# Patient Record
Sex: Male | Born: 1965 | Race: White | Marital: Married | Smoking: Former smoker
Health system: Southern US, Community
[De-identification: ages and names within clinical notes are randomized; demographics above are authoritative.]

## PROBLEM LIST (undated history)

## (undated) DIAGNOSIS — G2581 Restless legs syndrome: Secondary | ICD-10-CM

## (undated) DIAGNOSIS — M1990 Unspecified osteoarthritis, unspecified site: Secondary | ICD-10-CM

## (undated) DIAGNOSIS — F419 Anxiety disorder, unspecified: Secondary | ICD-10-CM

## (undated) DIAGNOSIS — E7800 Pure hypercholesterolemia, unspecified: Secondary | ICD-10-CM

## (undated) DIAGNOSIS — K219 Gastro-esophageal reflux disease without esophagitis: Secondary | ICD-10-CM

## (undated) DIAGNOSIS — G629 Polyneuropathy, unspecified: Secondary | ICD-10-CM

## (undated) DIAGNOSIS — M549 Dorsalgia, unspecified: Secondary | ICD-10-CM

## (undated) DIAGNOSIS — K449 Diaphragmatic hernia without obstruction or gangrene: Secondary | ICD-10-CM

## (undated) DIAGNOSIS — I10 Essential (primary) hypertension: Secondary | ICD-10-CM

## (undated) HISTORY — DX: Gastro-esophageal reflux disease without esophagitis: K21.9

## (undated) HISTORY — PX: OTHER SURGICAL HISTORY: SHX169

## (undated) HISTORY — DX: Unspecified osteoarthritis, unspecified site: M19.90

## (undated) HISTORY — DX: Diaphragmatic hernia without obstruction or gangrene: K44.9

## (undated) HISTORY — DX: Anxiety disorder, unspecified: F41.9

---
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
## 1999-09-05 ENCOUNTER — Encounter: Payer: Self-pay | Admitting: Orthopedic Surgery

## 1999-09-05 ENCOUNTER — Encounter: Admission: RE | Admit: 1999-09-05 | Discharge: 1999-09-05 | Payer: Self-pay | Admitting: Orthopedic Surgery

## 2001-04-18 ENCOUNTER — Ambulatory Visit (HOSPITAL_COMMUNITY): Admission: RE | Admit: 2001-04-18 | Discharge: 2001-04-18 | Payer: Self-pay | Admitting: Internal Medicine

## 2001-04-18 ENCOUNTER — Encounter: Payer: Self-pay | Admitting: Internal Medicine

## 2004-02-14 ENCOUNTER — Emergency Department (HOSPITAL_COMMUNITY): Admission: EM | Admit: 2004-02-14 | Discharge: 2004-02-14 | Payer: Self-pay | Admitting: Emergency Medicine

## 2004-09-12 ENCOUNTER — Ambulatory Visit: Payer: Self-pay | Admitting: Orthopedic Surgery

## 2004-09-21 ENCOUNTER — Ambulatory Visit (HOSPITAL_COMMUNITY): Admission: RE | Admit: 2004-09-21 | Discharge: 2004-09-21 | Payer: Self-pay | Admitting: Orthopedic Surgery

## 2004-09-29 ENCOUNTER — Encounter (HOSPITAL_COMMUNITY): Admission: RE | Admit: 2004-09-29 | Discharge: 2004-10-28 | Payer: Self-pay | Admitting: Orthopedic Surgery

## 2004-10-04 ENCOUNTER — Ambulatory Visit: Payer: Self-pay | Admitting: Orthopedic Surgery

## 2004-12-07 ENCOUNTER — Ambulatory Visit: Payer: Self-pay | Admitting: Pulmonary Disease

## 2005-02-02 ENCOUNTER — Ambulatory Visit: Payer: Self-pay | Admitting: Pulmonary Disease

## 2005-03-17 ENCOUNTER — Ambulatory Visit: Payer: Self-pay | Admitting: Pulmonary Disease

## 2005-05-03 ENCOUNTER — Emergency Department (HOSPITAL_COMMUNITY): Admission: EM | Admit: 2005-05-03 | Discharge: 2005-05-03 | Payer: Self-pay | Admitting: Emergency Medicine

## 2005-05-04 ENCOUNTER — Ambulatory Visit: Payer: Self-pay | Admitting: Pulmonary Disease

## 2005-05-16 ENCOUNTER — Ambulatory Visit: Payer: Self-pay | Admitting: Pulmonary Disease

## 2005-11-17 ENCOUNTER — Ambulatory Visit: Payer: Self-pay | Admitting: Pulmonary Disease

## 2006-05-16 ENCOUNTER — Ambulatory Visit: Payer: Self-pay | Admitting: Pulmonary Disease

## 2006-05-16 ENCOUNTER — Ambulatory Visit (HOSPITAL_COMMUNITY): Admission: RE | Admit: 2006-05-16 | Discharge: 2006-05-16 | Payer: Self-pay | Admitting: Pulmonary Disease

## 2007-05-07 ENCOUNTER — Ambulatory Visit: Payer: Self-pay | Admitting: Pulmonary Disease

## 2007-05-07 LAB — CONVERTED CEMR LAB: Streptococcus, Group A Screen (Direct): NEGATIVE

## 2008-04-02 ENCOUNTER — Ambulatory Visit: Payer: Self-pay | Admitting: Internal Medicine

## 2008-04-02 DIAGNOSIS — F411 Generalized anxiety disorder: Secondary | ICD-10-CM | POA: Insufficient documentation

## 2008-04-02 DIAGNOSIS — M1990 Unspecified osteoarthritis, unspecified site: Secondary | ICD-10-CM | POA: Insufficient documentation

## 2008-04-02 DIAGNOSIS — K219 Gastro-esophageal reflux disease without esophagitis: Secondary | ICD-10-CM

## 2008-04-02 DIAGNOSIS — M25519 Pain in unspecified shoulder: Secondary | ICD-10-CM

## 2008-07-20 ENCOUNTER — Ambulatory Visit: Payer: Self-pay | Admitting: Pulmonary Disease

## 2008-07-20 DIAGNOSIS — H6690 Otitis media, unspecified, unspecified ear: Secondary | ICD-10-CM | POA: Insufficient documentation

## 2008-07-27 ENCOUNTER — Ambulatory Visit: Payer: Self-pay | Admitting: Pulmonary Disease

## 2008-07-31 ENCOUNTER — Telehealth (INDEPENDENT_AMBULATORY_CARE_PROVIDER_SITE_OTHER): Payer: Self-pay | Admitting: *Deleted

## 2009-07-02 ENCOUNTER — Ambulatory Visit: Payer: Self-pay | Admitting: Pulmonary Disease

## 2009-07-02 DIAGNOSIS — S139XXA Sprain of joints and ligaments of unspecified parts of neck, initial encounter: Secondary | ICD-10-CM

## 2009-12-08 ENCOUNTER — Ambulatory Visit: Payer: Self-pay | Admitting: Pulmonary Disease

## 2009-12-10 ENCOUNTER — Emergency Department (HOSPITAL_COMMUNITY): Admission: EM | Admit: 2009-12-10 | Discharge: 2009-12-10 | Payer: Self-pay | Admitting: Emergency Medicine

## 2010-02-11 ENCOUNTER — Telehealth: Payer: Self-pay | Admitting: Adult Health

## 2010-05-27 ENCOUNTER — Ambulatory Visit: Payer: Self-pay | Admitting: Internal Medicine

## 2010-05-27 ENCOUNTER — Encounter: Payer: Self-pay | Admitting: Adult Health

## 2010-05-27 DIAGNOSIS — R3915 Urgency of urination: Secondary | ICD-10-CM | POA: Insufficient documentation

## 2010-05-27 LAB — CONVERTED CEMR LAB
ALT: 64 units/L — ABNORMAL HIGH (ref 0–53)
AST: 33 units/L (ref 0–37)
Albumin: 4.6 g/dL (ref 3.5–5.2)
Basophils Absolute: 0 10*3/uL (ref 0.0–0.1)
Basophils Relative: 0.5 % (ref 0.0–3.0)
Bilirubin Urine: NEGATIVE
CO2: 28 meq/L (ref 19–32)
Creatinine, Ser: 0.9 mg/dL (ref 0.4–1.5)
Glucose, Bld: 96 mg/dL (ref 70–99)
HCT: 42.2 % (ref 39.0–52.0)
Hgb A1c MFr Bld: 5.5 % (ref 4.6–6.5)
Lymphocytes Relative: 38.9 % (ref 12.0–46.0)
MCV: 90.9 fL (ref 78.0–100.0)
Monocytes Relative: 5.7 % (ref 3.0–12.0)
Neutrophils Relative %: 53.7 % (ref 43.0–77.0)
Platelets: 224 10*3/uL (ref 150.0–400.0)
Sodium: 136 meq/L (ref 135–145)
Specific Gravity, Urine: 1.025 (ref 1.000–1.030)
Total Protein, Urine: NEGATIVE mg/dL
Total Protein: 7.5 g/dL (ref 6.0–8.3)
Urine Glucose: NEGATIVE mg/dL
Urobilinogen, UA: 0.2 (ref 0.0–1.0)
pH: 5.5 (ref 5.0–8.0)

## 2010-12-01 NOTE — Assessment & Plan Note (Signed)
Summary: flu shot/klw  Nurse Visit   Allergies: 1)  Erythromycin  Orders Added: 1)  Admin 1st Vaccine [90471] 2)  Flu Vaccine 56yrs + [19147] Flu Vaccine Consent Questions     Do you have a history of severe allergic reactions to this vaccine? no    Any prior history of allergic reactions to egg and/or gelatin? no    Do you have a sensitivity to the preservative Thimersol? no    Do you have a past history of Guillan-Barre Syndrome? no    Do you currently have an acute febrile illness? no    Have you ever had a severe reaction to latex? no    Vaccine information given and explained to patient? yes    Are you currently pregnant? no    Lot Number:[id number]   Exp Date:04/28/2010   Site Given  Left Deltoid IM .lbflu Tammy Zamariya Neal  December 16, 2009 11:48 AM

## 2010-12-01 NOTE — Progress Notes (Signed)
Summary: rx request/ back pain  Phone Note Call from Patient   Caller: Spouse Call For: nadel Summary of Call: spouse Douglas Fletcher says pt c/o back pain x 2 days (they have been moving). requests rx vicodin for this. Douglas # [id number]. pharm [facility] [address] in [place].  Initial call taken by: Tivis Ringer, CNA,  February 11, 2010 10:58 AM  Follow-up for Phone Call        Pt was moving and thinks he has a pulled muscle and would like RX for the pain. Please advise.Michel Bickers CMA  February 11, 2010 11:21 AM    Prescriptions: VICODIN 5-500 MG TABS (HYDROCODONE-ACETAMINOPHEN) 1 by mouth every 4-6 hr as needed  #30 x 0   Entered by:   Randell Loop CMA   Authorized by:   Rubye Oaks NP   Signed by:   Randell Loop CMA on 02/11/2010   Method used:   Telephoned to ...       [facility] (retail)       [address]       [place], [place]  [address]       Ph: [phone]       Fax: [phone]   RxID:   [id number] VICODIN 5-500 MG TABS (HYDROCODONE-ACETAMINOPHEN) 1 by mouth every 4-6 hr as needed  #20 x 0   Entered by:   Randell Loop CMA   Authorized by:   Rubye Oaks NP   Signed by:   Randell Loop CMA on 02/11/2010   Method used:   Telephoned to ...       [facility] (retail)       [address]       [place], [place]  [address]       Ph: [phone]       Fax: [phone]   RxID:   [id number]  rx has been called into the pharamcy per TP for #30 so the #20 was not called to the pharmacy---with no additional refills. Randell Loop CMA  February 11, 2010 1:03 PM

## 2010-12-01 NOTE — Letter (Signed)
Summary: Out of Work  [employer] [employer]  [address]   [place], [place] [address]   Phone: [phone]  Fax: [phone]    May 27, 2010   Employee:  Ala Dach    To Whom It May Concern:   For Medical reasons, please excuse the above named employee from work for the following dates:  Start:   July 29,2011  End:   May 27, 2010  If you need additional information, please feel free to contact our office.         Sincerely,      Rubye Oaks, NP

## 2010-12-01 NOTE — Assessment & Plan Note (Signed)
Summary: back pain/ mbw   CC:  left flank pain x 3 days---stomach has been queasy at times when he eats.  History of Present Illness: 45 year-old white Fletcher, patient of Dr. Kriste Basque, who has a known history of osteoarthritis, anxiety and gastroesophageal reflux. Known history of chronic right arm pian previous injury 20 yrs ago s/p crush injury on conveyor belt.    6/4--Last 1 weeks worse pain in right arm. Producer, television/film/video work currently. Pain down right arm , dull ahce, increased with movement, pain into right shoulder. tx with advil.   July 20, 2008--Complains of 3 days of right ear pain, decreased hearing, fullness, nasal congestion and lightheadedness. --  July 02, 2009--Presents for an acute office viist. Complains of back pain down the spine on both sides, pain in neck at shoulders  states feels like "needles stabbing" when he moves. Sharp pains w/ movement, feel stiff, sore to touch,.    May 27, 2010 --Presents for an acute office visit. Complains left flank pain x 3 days---stomach has been queasy at times when he eats, urinary frequency and urgency, testicle pressure. Denies chest pain, dyspnea, orthopnea, hemoptysis, fever, n/v/d, edema, headache,recent travel, hematuria, dysuria.  Has no insurance. Has not seen Dr. Kriste Basque in long time. Has new job w/ heating and cooling. Hurt back 2-3 weeks w/ low back pain--tx w/ ice and heat. Improved but now left side of back is sore. URine was clear today . He drinks alot of liquids, 5 cups of coffee this am, water during daytime.   Preventive Screening-Counseling & Management  Alcohol-Tobacco     Smoking Status: quit     Year Started: 2003     Year Quit: 2006     Pack years: 2ppd x2 years  Allergies: 1)  Erythromycin  Comments:  Nurse/Medical Assistant: The patient's medications and allergies were reviewed with the patient and were updated in the Medication and Allergy Lists.  Past History:  Past Medical History: Last updated:  04/02/2008 GERD (ICD-530.81) ANXIETY (ICD-300.00) OSTEOARTHRITIS (ICD-715.90)    Family History: Last updated: 04/02/2008 heart disease in grandfather mother deceased at age 96 from car accident  Social History: Last updated: 04/02/2008 pt states he smokes marijuana once a day works as a Curator drinks alcohol socially married with 3 children  Review of Systems      See HPI  Vital Signs:  Patient profile:   45 year old male Height:      70 inches Weight:      261.13 pounds BMI:     37.60 O2 Sat:      98 % on Room air Temp:     100.1 degrees F oral Pulse rate:   73 / minute BP sitting:   140 / 86  (right arm) Cuff size:   regular  Vitals Entered By: Randell Loop CMA (May 27, 2010 9:35 AM)  O2 Sat at Rest %:  98 O2 Flow:  Room air CC: left flank pain x 3 days---stomach has been queasy at times when he eats Is Patient Diabetic? No Pain Assessment Patient in pain? yes      Onset of pain  left flank pain x 3 days Comments meds and allergies updated today with pt daytime phone number reviewed with pt Randell Loop CMA  May 27, 2010 9:40 AM    Physical Exam  Additional Exam:  GENERAL:  A/Ox3; pleasant & cooperative.NAD HEENT:  [place]/AT, NOSE-clear, THROAT-clear & wnl. NECK:  Supple w/ fair ROM; no JVD; normal  carotid impulses w/o bruits; no thyromegaly or nodules palpated; no lymphadenopathy. CHEST:  Clear to P & A; w/o, wheezes/ rales/ or rhonchi. HEART:  RRR, no m/r/g  heard ABDOMEN:  Soft & nt; nml bowel sounds; no organomegaly or masses detected. EXT: Warm bilat,  no calf pain, edema, clubbing, pulses intact BACK: : tender along left lower back, neg SLR. equal strength.   NEURO: intact no focal deficits noted, equal hand grips.  Skin: no rash/lesion    Impression & Recommendations:  Problem # 1:  OSTEOARTHRITIS (ICD-715.90)  Back strain. Urine was clear.  Use ibuprofen three times a day w/ food for 5 days alternate ice and heat' The following  medications were removed from the medication list:    Vicodin 5-500 Mg Tabs (Hydrocodone-acetaminophen) .Marland Kitchen... 1 by mouth every 4-6 hrs as needed    Vicodin 5-500 Mg Tabs (Hydrocodone-acetaminophen) .Marland Kitchen... 1 by mouth every 4-6 hr as needed His updated medication list for this problem includes:    Ibuprofen 800 Mg Tabs (Ibuprofen) .Marland Kitchen... Take 1 tablet by mouth three times a day  Orders: TLB-CBC Platelet - w/Differential (85025-CBCD) TLB-BMP (Basic Metabolic Panel-BMET) (80048-METABOL) TLB-Hepatic/Liver Function Pnl (80076-HEPATIC) Est. Patient Level II ([address]109)  Problem # 2:  URINARY URGENCY (UEA-540.98) urine clear  will check A1C to rule out DM no family hx may be having urgecy/frequeny due high amt of coffee daily advsied to decrease.  Orders: TLB-Udip w/ Micro (81001-URINE) T-Urine Culture (Spectrum Order) [id number] [phone]) TLB-CBC Platelet - w/Differential (85025-CBCD) TLB-BMP (Basic Metabolic Panel-BMET) (80048-METABOL) TLB-Hepatic/Liver Function Pnl (80076-HEPATIC) TLB-A1C / Hgb A1C (Glycohemoglobin) (83036-A1C) Est. Patient Level II (62130)  Complete Medication List: 1)  Hydrochlorothiazide 25 Mg Tabs (Hydrochlorothiazide) .Marland Kitchen.. 1 by mouth once daily 2)  Gabapentin 100 Mg Caps (Gabapentin) .... Take 1 capsule by mouth two times a day 3)  Omeprazole 20 Mg Tbec (Omeprazole) .... Take 1 tablet by mouth two times a day 4)  One-a-day Mens Tabs (Multiple vitamin) .... Take 1 tablet by mouth once a day 5)  Osteo Bi-flex Joint Shield Tabs (Misc natural products) .... Take 1 tablet by mouth once a day 6)  Simvastatin 40 Mg Tabs (Simvastatin) .Marland Kitchen.. 1 by mouth once daily 7)  Ibuprofen 800 Mg Tabs (Ibuprofen) .... Take 1 tablet by mouth three times a day 8)  Zyrtec Allergy 10 Mg Tabs (Cetirizine hcl) .Marland Kitchen.. 1 by mouth at bedtime as needed drainage  Patient Instructions: 1)  Ibuprofen 800mg  three times a day w/ food x 5 days  2)  Alternate ice and heat to back as needed  3)  increase fluids.    4)  I will call with lab results.  5)  follow up Dr. Kriste Basque in 3 months for physical

## 2011-01-18 LAB — URINALYSIS, ROUTINE W REFLEX MICROSCOPIC
Bilirubin Urine: NEGATIVE
Hgb urine dipstick: NEGATIVE
Nitrite: NEGATIVE

## 2011-01-18 LAB — DIFFERENTIAL
Eosinophils Absolute: 0.1 10*3/uL (ref 0.0–0.7)
Lymphocytes Relative: 33 % (ref 12–46)
Lymphs Abs: 2.9 10*3/uL (ref 0.7–4.0)
Monocytes Relative: 8 % (ref 3–12)
Neutro Abs: 5.1 10*3/uL (ref 1.7–7.7)

## 2011-01-18 LAB — COMPREHENSIVE METABOLIC PANEL
Albumin: 4.4 g/dL (ref 3.5–5.2)
Alkaline Phosphatase: 121 U/L — ABNORMAL HIGH (ref 39–117)
CO2: 26 mEq/L (ref 19–32)
Chloride: 105 mEq/L (ref 96–112)
Creatinine, Ser: 0.93 mg/dL (ref 0.4–1.5)
GFR calc Af Amer: >60 mL/min (ref >60)
Total Bilirubin: 0.7 mg/dL (ref 0.3–1.2)

## 2011-01-18 LAB — CBC
Hemoglobin: 14.7 g/dL (ref 13.0–17.0)
RBC: 4.81 MIL/uL (ref 4.22–5.81)
WBC: 8.9 10*3/uL (ref 4.0–10.5)

## 2011-01-18 LAB — AMYLASE: Amylase: 41 U/L (ref 0–105)

## 2011-02-16 ENCOUNTER — Encounter: Payer: Self-pay | Admitting: *Deleted

## 2011-02-16 ENCOUNTER — Telehealth: Payer: Self-pay | Admitting: Pulmonary Disease

## 2011-02-16 MED ORDER — DOXYCYCLINE HYCLATE 100 MG PO CAPS: ORAL_CAPSULE | ORAL | Status: DC

## 2011-02-16 NOTE — Telephone Encounter (Signed)
Per SN---we have no openings but we can rec treatment with doxycycline 100mg    #14  1 po bid , rest, plenty of fluids and tylenol as needed. thanks

## 2011-02-16 NOTE — Telephone Encounter (Signed)
Called and spoke with pt.  Informed him SN ok'd work note for today and tomorrow and that note will be left at front desk for pt to pick up. Pt stated he will have his wife pick note up tomorrow.

## 2011-02-16 NOTE — Telephone Encounter (Signed)
Spoke w/ pt and advised him os SN recs. Pt verbalized understanding. Pt aware rx was sent to pharmacy. Pt is wanting a work not for today and tomorrow. Please advise Dr. Kriste Basque if okay to do. Thanks  Carver Fila, CMA

## 2011-02-16 NOTE — Telephone Encounter (Signed)
Ok for work note for today and tomorrow. thanks

## 2011-02-16 NOTE — Telephone Encounter (Signed)
Spoke w/ Lupita Leash and she states pt pulled a tick off his groin area yesterday. Pt c/o the spot being red, raised, little heat at the spot. Pt states he is very achy all over and feels like he may have a fever but not sure. Denies nausea and vomiting. Pt wanted to be seen but have no available openings. Please advise Dr. Kriste Basque. Thanks  Allergies  Allergen Reactions  . Erythromycin     REACTION: rash/hives, swelling in mouth and throat    Carver Fila, CMA

## 2011-02-27 ENCOUNTER — Telehealth: Payer: Self-pay | Admitting: Pulmonary Disease

## 2011-02-27 NOTE — Telephone Encounter (Signed)
Per SN---he will need ov this week with SN---in the meantime rest, increase fluids, tylenol every 4-6 hours as needed.   Can add pt on 5-1 at 2pm. thanks

## 2011-02-27 NOTE — Telephone Encounter (Signed)
Spoke w/ Douglas Fletcher and advised her of SN recs. She verbalized understanding and pt is coming in 5/1 at 2 to be evaluated by SN.

## 2011-02-27 NOTE — Telephone Encounter (Signed)
Spoke w/ Douglas Fletcher pt wife and she states pt finished his abx on Friday from being bit by a tick. Wife states pt is still achy, feels confused and is losing train of thought. Wife states the spot where he was bitten at by the tick is still red and itchy. Douglas Fletcher wants to know if pt needs another round of abx or if he needs to come in and be evaluated. Please advise Dr. Kriste Basque. Thanks  Allergies  Allergen Reactions  . Erythromycin     REACTION: rash/hives, swelling in mouth and throat    Carver Fila, CMA

## 2011-02-28 ENCOUNTER — Other Ambulatory Visit (INDEPENDENT_AMBULATORY_CARE_PROVIDER_SITE_OTHER): Payer: BC Managed Care – PPO | Admitting: Pulmonary Disease

## 2011-02-28 ENCOUNTER — Encounter: Payer: Self-pay | Admitting: *Deleted

## 2011-02-28 ENCOUNTER — Other Ambulatory Visit: Payer: Self-pay | Admitting: Pulmonary Disease

## 2011-02-28 ENCOUNTER — Encounter: Payer: Self-pay | Admitting: Pulmonary Disease

## 2011-02-28 ENCOUNTER — Ambulatory Visit (INDEPENDENT_AMBULATORY_CARE_PROVIDER_SITE_OTHER): Payer: BC Managed Care – PPO | Admitting: Pulmonary Disease

## 2011-02-28 ENCOUNTER — Other Ambulatory Visit (INDEPENDENT_AMBULATORY_CARE_PROVIDER_SITE_OTHER): Payer: BC Managed Care – PPO

## 2011-02-28 VITALS — BP 122/80 | HR 68 | Temp 99.2°F | Ht 70.0 in | Wt 239.0 lb

## 2011-02-28 DIAGNOSIS — K219 Gastro-esophageal reflux disease without esophagitis: Secondary | ICD-10-CM

## 2011-02-28 DIAGNOSIS — F411 Generalized anxiety disorder: Secondary | ICD-10-CM

## 2011-02-28 DIAGNOSIS — M1990 Unspecified osteoarthritis, unspecified site: Secondary | ICD-10-CM

## 2011-02-28 DIAGNOSIS — E785 Hyperlipidemia, unspecified: Secondary | ICD-10-CM

## 2011-02-28 DIAGNOSIS — E7800 Pure hypercholesterolemia, unspecified: Secondary | ICD-10-CM

## 2011-02-28 DIAGNOSIS — W57XXXA Bitten or stung by nonvenomous insect and other nonvenomous arthropods, initial encounter: Secondary | ICD-10-CM | POA: Insufficient documentation

## 2011-02-28 LAB — BASIC METABOLIC PANEL
Calcium: 10.2 mg/dL (ref 8.4–10.5)
Chloride: 104 mEq/L (ref 96–112)
Creatinine, Ser: 1 mg/dL (ref 0.4–1.5)
GFR: 89.25 mL/min (ref >60.00)
Potassium: 4.4 mEq/L (ref 3.5–5.1)
Sodium: 142 mEq/L (ref 135–145)

## 2011-02-28 LAB — CBC WITH DIFFERENTIAL/PLATELET
Basophils Absolute: 0 10*3/uL (ref 0.0–0.1)
Basophils Relative: 0.1 % (ref 0.0–3.0)
Eosinophils Absolute: 0.1 10*3/uL (ref 0.0–0.7)
HCT: 43.3 % (ref 39.0–52.0)
Hemoglobin: 14.9 g/dL (ref 13.0–17.0)
Lymphs Abs: 2.9 10*3/uL (ref 0.7–4.0)
MCHC: 34.5 g/dL (ref 30.0–36.0)
Monocytes Absolute: 0.4 10*3/uL (ref 0.1–1.0)
Neutro Abs: 3.9 10*3/uL (ref 1.4–7.7)
WBC: 7.4 10*3/uL (ref 4.5–10.5)

## 2011-02-28 LAB — HEPATIC FUNCTION PANEL
Albumin: 4.5 g/dL (ref 3.5–5.2)
Alkaline Phosphatase: 78 U/L (ref 39–117)

## 2011-02-28 LAB — SEDIMENTATION RATE: Sed Rate: 12 mm/hr (ref 0–22)

## 2011-02-28 LAB — LIPID PANEL: VLDL: 37.8 mg/dL (ref 0.0–40.0)

## 2011-02-28 NOTE — Patient Instructions (Signed)
You have had a tick exposure & a 10d course of empiric doxycycline Rx...    No fever, no rash, but marked systemic symptoms as you described...    For now>  We need lab work up to investigate the possible diagnoses...       Rest, Fluids, Tylenol> Rx for now & we will call you w/ the blood report...  Hold off on refills of the HCTZ & Simvastatin for now until we can assess your lab work...    Please return in the AM for a fasting lipid profile to assess the response to the Simvastatin Rx.

## 2011-03-01 ENCOUNTER — Other Ambulatory Visit (INDEPENDENT_AMBULATORY_CARE_PROVIDER_SITE_OTHER): Payer: BC Managed Care – PPO

## 2011-03-01 DIAGNOSIS — E7800 Pure hypercholesterolemia, unspecified: Secondary | ICD-10-CM

## 2011-03-01 LAB — LIPID PANEL
Cholesterol: 197 mg/dL (ref 0–200)
LDL Cholesterol: 122 mg/dL — ABNORMAL HIGH (ref 0–99)
Total CHOL/HDL Ratio: 5
Triglycerides: 176 mg/dL — ABNORMAL HIGH (ref 0.0–149.0)
VLDL: 35.2 mg/dL (ref 0.0–40.0)

## 2011-03-01 LAB — B. BURGDORFI ANTIBODIES: B burgdorferi Ab IgG+IgM: 0.2 {ISR}

## 2011-03-02 ENCOUNTER — Telehealth: Payer: Self-pay | Admitting: Pulmonary Disease

## 2011-03-03 ENCOUNTER — Telehealth: Payer: Self-pay | Admitting: Pulmonary Disease

## 2011-03-03 MED ORDER — SIMVASTATIN 40 MG PO TABS: 40.0000 mg | ORAL_TABLET | Freq: Every day | ORAL | Status: DC

## 2011-03-03 MED ORDER — HYDROCHLOROTHIAZIDE 25 MG PO TABS: 25.0000 mg | ORAL_TABLET | Freq: Every day | ORAL | Status: DC

## 2011-03-03 NOTE — Telephone Encounter (Signed)
Please see other phone note from today.  Pt is aware of lab results.

## 2011-03-03 NOTE — Telephone Encounter (Signed)
Called and spoke with pts wife and she is aware of lab results per SN--all negative--rec to cont the simvastatin 40mg  daily and this has been sent to the pharmacy for refills.  Rest, tylenol prn, fluids over the weekend and pt is ok to return to work on Monday.  Lupita Leash stated that pt is feeling some better today.  Will call for any further  concerns

## 2011-03-03 NOTE — Telephone Encounter (Signed)
LMTCB

## 2011-03-03 NOTE — Telephone Encounter (Signed)
Spoke with pt and notified rx for hctz sent to pharm.  I advised we will have to ask SN about the lab results. Pls advise thanks!

## 2011-03-06 ENCOUNTER — Encounter: Payer: Self-pay | Admitting: Pulmonary Disease

## 2011-03-06 NOTE — Progress Notes (Signed)
Subjective:    Patient ID: Douglas Fletcher, male    DOB: [birth year], 45 y.o.   MRN: [id number]  HPI 45 y/o WM, whom I last saw in 2007, has been seen several times by TP in the interim for ear pain, back pain, neck pain, etc...  Apparently still goes to the [facility] [place] [facility] where he is service connected for PTSD, and gets meds from them... Presents after tick exposure 2 weeks ago followed by feeling poorly, HA, neck pain, joint pain, balance off, not thinking clearly, decr energy, etc;  Doxycycline 100mg  Bid x10d was called in empirically & already finished> symptoms linger but no fever & no objective findings on exam;  We discussed checking lab work (all neg/ wnl) and Rx w/ Rest, Tylenol, Fluids, & he wants 1 week OOW- OK (until 03/06/11)... SEE MEDICAL PROBLEMS BELOW:  TICK EXPOSURE> all labs, inflamm markers, & titers are neg...        PROBLEM LIST:  Hx Allergic Rhinitis>  He takes OTC antihist prn...  HBP>  Prev treated w/ HCTZ but now controlled on diet alone... ~  5/12:  BP= 122/80, weight = 239#, we discussed nosalt, diet+ exercise for wt reduction...  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  HYPERCHOLESTEROLEMIA>  On diet Rx alone... ~  FLP 5/12 showed TChol 209, TG 189, HDL 42, LDL 144 but he says this wasn't fasting... ~  Repeat FLP 5/12 Fasting showed TChol 197, TG 176, HDL 40, LDL 122... rec get on diet, get wt down...  OBESITY>  We reviewed diet + exercise recommendations for weight reduction... ~  Prev weights here 208# - 261# during the 2000s... ~  Weight 5/12 = 239#  GERD>  Prev treated w/ PPI therapy, now uses OTC Prilosec Prn...  Hx Fatty Liver Disease>  Hx elev LFTs assoc w/ his obesity & felt to represent FLD, advised on need for diet & wt reduction... ~  Labs 5/12 showed norm LFTs w/ SGOT= 22, SGPT= 41  Hx Borderline Hypogonadism>  Prev hx borderline Low-T, not placed on therapy & it is unclear what the [place] [facility] has done for further eval or  follow up of this problem...  DJD>  He has had on-going complaints of back & neck pain w/ [facility], he does not know results 7 we do not have prev XRays or reports to review...  Hx RLS>  Prev tried on Reguip Rx...  PTSD & ANXIETY>  Not currently on medication...  He is service connected for PTSD from his service in [name] (his father died in his 18's in [name]); he works for a Training and development officer...  Health Maintenance> ~  GI:  He is 45 y/o and will require screening colonoscopy at ~48... ~  GU:  He denies GU symptoms and will require screening PSA at ~50... ~  Immunizations:  He has not been getting the yearly Flu vaccines;  ?when last Tetanus shot was given;  No current indication for pre-age65 Pneumonia vaccine...   No past surgical history on file.   Outpatient Encounter Prescriptions as of 02/28/2011  Medication Sig Dispense Refill  . cetirizine (ZYRTEC) 10 MG tablet Take 10 mg by mouth at bedtime.        . gabapentin (NEURONTIN) 100 MG capsule Take 100 mg by mouth 2 (two) times daily.        Marland Kitchen ibuprofen (ADVIL,MOTRIN) 800 MG tablet Take 800 mg by mouth every 8 (eight) hours  as needed.        . Multiple Vitamins-Minerals (PX MENS MULTIVITAMINS PO) Take 1 tablet by mouth daily.        Marland Kitchen omeprazole (PRILOSEC) 20 MG capsule Take 20 mg by mouth 2 (two) times daily.        Marland Kitchen DISCONTD: hydrochlorothiazide 25 MG tablet Take 25 mg by mouth daily.        Marland Kitchen DISCONTD: simvastatin (ZOCOR) 40 MG tablet Take 40 mg by mouth at bedtime.        . Misc Natural Products (OSTEO BI-FLEX ADV DOUBLE ST PO) Take 1 tablet by mouth daily.        Marland Kitchen DISCONTD: doxycycline (VIBRAMYCIN) 100 MG capsule 1 tablet twice a day  14 capsule  0    Allergies  Allergen Reactions  . Erythromycin     REACTION: rash/hives, swelling in mouth and throat    Review of Systems    Constitutional:  Denies F/C/S, anorexia, unexpected weight change. HEENT:  Sl HA; no visual changes, earache, nasal  symptoms, sore throat, hoarseness. Resp:  No cough, sputum, hemoptysis; no SOB, tightness, wheezing. Cardio:  No CP, palpit, DOE, orthopnea, edema. GI:  Denies N/V/D/C or blood in stool; no reflux, abd pain, distention, or gas. GU:  No dysuria, freq, urgency, hematuria, or flank pain. MS:  Denies joint pain, swelling, tenderness, or decr ROM; no neck pain, back pain, etc. Neuro:  No tremors, seizures, dizziness, syncope, weakness, numbness, gait abn. Skin:  No suspicious lesions or skin rash. Heme:  No adenopathy, bruising, bleeding. Psyche: Denies confusion, sleep disturbance, hallucinations, anxiety, depression.   Objective:   Physical Exam     WD, Obese, 45 y/o WM in NAD... Vital Signs:  Reviewed...  General:  Alert & oriented; pleasant & cooperative... HEENT:  [place]/AT, EOM-wnl, PERRLA, Fundi-benign, EACs-clear, TMs-wnl, NOSE-clear, THROAT-clear & wnl. Neck:  Supple w/ full ROM; no JVD; normal carotid impulses w/o bruits; no thyromegaly or nodules palpated; no lymphadenopathy. Chest:  Clear to P & A; without wheezes/ rales/ or rhonchi heard... Heart:  Regular Rhythm; norm S1 & S2 without murmurs/ rubs/ or gallops detected... Abdomen:  Soft & nontender; normal bowel sounds; no organomegaly or masses palpated... Ext:  Normal ROM; without deformities or arthritic changes; no varicose veins, venous insuffic, or edema;  Pulses intact w/o bruits... Neuro:  CNs II-XII intact; motor testing normal; sensory testing normal; gait normal & balance OK... Derm:  No lesions noted; no rash etc... Lymph:  No cervical, supraclavicular, axillary, or inguinal adenopathy palpated...    Assessment & Plan:   TICK Exposure>  Labs ret neg, prob viral syndrome, rec rest, fluids, tylenol, etc...    HBP>  He wants to control on diet; that means no salt & lose wt!!!  CHOL> He wants to control w/o meds; therefore needs better low fat diet & wt reduction...  OBESITY>  Weight reduction through vigorous diet  efforts and exercise...  GERD>  We discussed the use of OTC PPI vs H2Blockers prn...  DJD>  Wt reduction is very important here too  PTSD/ Anxiety>  Per his [facility], he is not on anxiolytic meds.Marland KitchenMarland Kitchen

## 2011-03-10 ENCOUNTER — Telehealth: Payer: Self-pay | Admitting: Pulmonary Disease

## 2011-03-10 MED ORDER — PREDNISONE (PAK) 5 MG PO TABS: ORAL_TABLET | ORAL | Status: DC

## 2011-03-10 NOTE — Telephone Encounter (Signed)
LMOMTCB x 1 

## 2011-03-10 NOTE — Telephone Encounter (Signed)
Spoke w/ pt wife and she states he has poison oak from [employer]". Pt states he has had this x 5 days. Wife states this is all over his arms mainly and is very itchy. Pt has been using fluocinonide cream but is not helping. Pt wife is requesting something else be called into pharmacy. Please advise Dr. Kriste Basque. Thanks  Allergies  Allergen Reactions  . Erythromycin     REACTION: rash/hives, swelling in mouth and throat    Carver Fila, CMA

## 2011-03-10 NOTE — Telephone Encounter (Signed)
Called and spoke with pts wife and she is aware of meds sent to the pharmacy for pred dose pak.

## 2011-03-17 NOTE — Assessment & Plan Note (Signed)
[facility]                             PULMONARY OFFICE NOTE   NAME:Douglas Fletcher, Douglas Fletcher                      MRN:          [id number]  DATE:05/08/2007                            DOB:          [birth year]    HISTORY OF THE PRESENT ILLNESS:  The patient is a 45 year old white man,  patient of Dr. Kriste Basque, who has a known history of osteoarthritis, anxiety  and gastroesophageal reflux who presents today for a three-day history  of a sore throat, painful swallowing and white patches on the back of  his tonsils.  The patient denies any chest pain, shortness of breath and  difficulty swallowing.  The patient has had some low-grade fevers and  chills.   PAST MEDICAL HISTORY:  The past medical history is reviewed.   MEDICATIONS:  Current medications are reviewed.   PHYSICAL EXAMINATION:  GENERAL APPEARANCE:  The patient is a pleasant  male in no acute distress.  VITAL SIGNS:  The patient is afebrile with stable vital signs.  Oxygen  saturation is 97% on room air.  HEENT:  Nasopharynx is pale.  TMs are normal.  Posterior pharynx is  erythematous with a few scattered white patches along the bilateral  tonsils.  The tonsils are 1+ bilaterally.  NECK:  The neck is supple with some anterior cervical adenopathy.  No  JVD.  Negative nuchal rigidity.  LUNGS:  The lung sounds are clear.  HEART:  Cardiac - regular rate and rhythm.  ABDOMEN:  The abdomen is soft without any palpable hepatosplenomegaly.  EXTREMITIES:  The extremities are warm without any edema.  SKIN:  The skin is warm without rash.   LABORATORY DATA:  Strep test is negative.   IMPRESSION AND PLAN:  Acute tonsillitis.   The patient is to begin Pen-VK 500 mg twice a day times 10 days and  Magic mouthwash as needed, and also Tylenol as needed.   FOLLOW UP:  The patient is to return back to see Dr. Kriste Basque as scheduled  or sooner if needed.      Rubye Oaks, NP  Electronically Signed      Lonzo Cloud. Kriste Basque, MD  Electronically Signed   TP/MedQ  DD: 05/07/2007  DT: 05/08/2007  Job #: [id number]

## 2011-05-29 ENCOUNTER — Telehealth: Payer: Self-pay | Admitting: Pulmonary Disease

## 2011-05-29 DIAGNOSIS — M549 Dorsalgia, unspecified: Secondary | ICD-10-CM

## 2011-05-29 NOTE — Telephone Encounter (Signed)
Per sn no xray, just refer to ortho--wife aware will refer to someone for his back need appt asap per sn

## 2011-06-06 ENCOUNTER — Ambulatory Visit: Payer: BC Managed Care – PPO | Admitting: Adult Health

## 2011-08-05 ENCOUNTER — Observation Stay (HOSPITAL_COMMUNITY)
Admission: EM | Admit: 2011-08-05 | Discharge: 2011-08-07 | Disposition: A | Discharge status: Home / Self Care | Payer: BC Managed Care – PPO | Attending: Internal Medicine | Admitting: Internal Medicine

## 2011-08-05 ENCOUNTER — Emergency Department (HOSPITAL_COMMUNITY): Payer: BC Managed Care – PPO

## 2011-08-05 DIAGNOSIS — E7800 Pure hypercholesterolemia, unspecified: Secondary | ICD-10-CM | POA: Insufficient documentation

## 2011-08-05 DIAGNOSIS — K219 Gastro-esophageal reflux disease without esophagitis: Secondary | ICD-10-CM | POA: Insufficient documentation

## 2011-08-05 DIAGNOSIS — F411 Generalized anxiety disorder: Secondary | ICD-10-CM | POA: Insufficient documentation

## 2011-08-05 DIAGNOSIS — T3995XA Adverse effect of unspecified nonopioid analgesic, antipyretic and antirheumatic, initial encounter: Secondary | ICD-10-CM | POA: Insufficient documentation

## 2011-08-05 DIAGNOSIS — K2960 Other gastritis without bleeding: Principal | ICD-10-CM | POA: Insufficient documentation

## 2011-08-05 DIAGNOSIS — M1710 Unilateral primary osteoarthritis, unspecified knee: Secondary | ICD-10-CM | POA: Insufficient documentation

## 2011-08-05 DIAGNOSIS — I10 Essential (primary) hypertension: Secondary | ICD-10-CM | POA: Insufficient documentation

## 2011-08-05 DIAGNOSIS — R079 Chest pain, unspecified: Secondary | ICD-10-CM | POA: Insufficient documentation

## 2011-08-05 LAB — POCT I-STAT TROPONIN I
Troponin i, poc: 0 ng/mL (ref 0.00–0.08)
Troponin i, poc: 0 ng/mL (ref 0.00–0.08)

## 2011-08-05 LAB — BASIC METABOLIC PANEL
BUN: 17 mg/dL (ref 6–23)
Chloride: 101 mEq/L (ref 96–112)
GFR calc Af Amer: >90 mL/min (ref >90)
Potassium: 3.4 mEq/L — ABNORMAL LOW (ref 3.5–5.1)
Sodium: 139 mEq/L (ref 135–145)

## 2011-08-05 LAB — DIFFERENTIAL
Basophils Absolute: 0 10*3/uL (ref 0.0–0.1)
Eosinophils Relative: 2 % (ref 0–5)
Lymphocytes Relative: 17 % (ref 12–46)
Neutro Abs: 5.4 10*3/uL (ref 1.7–7.7)

## 2011-08-05 LAB — CBC
HCT: 41.8 % (ref 39.0–52.0)
RDW: 12.3 % (ref 11.5–15.5)
WBC: 7.7 10*3/uL (ref 4.0–10.5)

## 2011-08-06 ENCOUNTER — Inpatient Hospital Stay (HOSPITAL_COMMUNITY): Payer: BC Managed Care – PPO

## 2011-08-06 ENCOUNTER — Emergency Department (HOSPITAL_COMMUNITY): Payer: BC Managed Care – PPO

## 2011-08-06 LAB — CARDIAC PANEL(CRET KIN+CKTOT+MB+TROPI)
CK, MB: 2.3 ng/mL (ref 0.3–4.0)
CK, MB: 2 ng/mL (ref 0.3–4.0)
CK, MB: 1.9 ng/mL (ref 0.3–4.0)
Relative Index: 1.9 (ref 0.0–2.5)
Relative Index: 1.9 (ref 0.0–2.5)
Relative Index: INVALID (ref 0.0–2.5)
Total CK: 120 U/L (ref 7–232)
Total CK: 107 U/L (ref 7–232)
Total CK: 96 U/L (ref 7–232)
Troponin I: <0.3 ng/mL (ref <0.30)
Troponin I: <0.3 ng/mL (ref <0.30)
Troponin I: <0.3 ng/mL (ref <0.30)

## 2011-08-06 LAB — RAPID URINE DRUG SCREEN, HOSP PERFORMED
Cocaine: NOT DETECTED
Opiates: NOT DETECTED
Tetrahydrocannabinol: POSITIVE — AB

## 2011-08-06 LAB — COMPREHENSIVE METABOLIC PANEL
ALT: 38 U/L (ref 0–53)
AST: 22 U/L (ref 0–37)
Albumin: 4.2 g/dL (ref 3.5–5.2)
Alkaline Phosphatase: 86 U/L (ref 39–117)
BUN: 12 mg/dL (ref 6–23)
CO2: 28 mEq/L (ref 19–32)
Calcium: 9.1 mg/dL (ref 8.4–10.5)
Chloride: 98 mEq/L (ref 96–112)
Creatinine, Ser: 0.82 mg/dL (ref 0.50–1.35)
GFR calc Af Amer: >90 mL/min (ref >90)
GFR calc non Af Amer: >90 mL/min (ref >90)
Glucose, Bld: 94 mg/dL (ref 70–99)
Potassium: 3.3 mEq/L — ABNORMAL LOW (ref 3.5–5.1)
Sodium: 138 mEq/L (ref 135–145)
Total Bilirubin: 0.7 mg/dL (ref 0.3–1.2)
Total Protein: 7.1 g/dL (ref 6.0–8.3)

## 2011-08-06 MED ORDER — IOHEXOL 350 MG/ML SOLN
100.0000 mL | Freq: Once | INTRAVENOUS | Status: AC | PRN
  Administered 2011-08-06: 100 mL via INTRAVENOUS

## 2011-08-06 NOTE — H&P (Signed)
NAME:  Douglas Fletcher, Douglas Fletcher NO.:  [id number]  MEDICAL RECORD NO.:  [id number]  LOCATION:  [facility]                         FACILITY:  [facility]  PHYSICIAN:  Douglas Fletcher, MDDATE OF BIRTH:  1966/09/19  DATE OF ADMISSION:  08/05/2011 DATE OF DISCHARGE:                             HISTORY & PHYSICAL   PRIMARY CARE PHYSICIAN:  Douglas Cloud. Kriste Basque, MD  CHIEF COMPLAINT:  Chest pain.  HISTORY OF PRESENTING ILLNESS:  Douglas Fletcher is a 45 year old gentleman who has midsternal chest pain which he describes as a burning up into his throat.  It started about 9 o'clock in the morning and it hurt him all day.  He tried Alka-Seltzer, no improvement.  He then took an extra Nexium, no improvement later.  He took an aspirin in case it was his heart, no improvement.  As the pain continued hours and hours over the day, he became concerned and presented to the emergency department.  The patient has never had pain quite like this before.  It was 10/10 at one point.  He denies any shortness of breath.  No nausea, no radiation of the pain except into his throat.  PAST MEDICAL HISTORY:  Significant for GERD, osteoarthritis, he has terrible knee pain which is why he takes the NSAIDs frequently, he has history of hypertension, hypercholesterolemia, anxiety, history of appendectomy, history of ganglion cyst removal.  MEDICATIONS:  On arrival include: 1. Nexium 40 mg p.o. daily. 2. Ibuprofen 800 mg p.o. three times a day which he is taking every     day for some time. 3. Vitamin C daily. 4. Multivitamin daily. 5. Aspirin 81 mg daily. 6. Hydrochlorothiazide 25 mg p.o. daily. 7. Simvastatin 40 mg daily.  SOCIAL HISTORY:  He does not smoke cigarettes, occasional alcohol.  The patient used to be a heavy drinker but quit that several years ago.  FAMILY HISTORY:  The patient has a daughter who was in a car accident yesterday.  Douglas Fletcher had breast cancer.  REVIEW OF SYSTEMS:  The patient has lots of  joint trouble.  He has back pain he says between his shoulder blades that bother him quite a bit. He has severe knee pain all the time.  In general, he does not have trouble with chest pain.  He does have occasional heartburn though that has really not been much of an issue since he quit drinking several years ago.  No significant headache.  He has not vomited any blood.  No blood in his urine or stool.  No trouble with chest pains generally.  No trouble with lower extremity edema.  All other systems reviewed and are negative.  On arrival in the emergency department, blood pressure initially was 155/91, temperature 98.2, pulse 111, respiratory rate 22, O2 sat 100% on room air.  PHYSICAL EXAMINATION:  GENERAL:  The patient is well-nourished, well- developed and in no acute distress. HEENT:  Normocephalic, atraumatic.  His pupils are equal and round. Sclera nonicteric.  Oral mucosa moist. NECK:  Supple.  No lymphadenopathy, no thyromegaly.  No jugular venous distention. CARDIAC:  Regular rate and rhythm.  No murmurs, gallops or rubs. LUNGS:  Clear to auscultation bilaterally.  No  wheezes, rhonchi or rales. ABDOMEN:  Soft.  He has some mild right upper quadrant tenderness.  No rebound.  He does have bowel sounds.  I did not appreciate any hepatosplenomegaly.  EXTREMITIES:  No evidence of clubbing, cyanosis or pitting edema.  He has palpable DP pulses bilaterally. SKIN:  Warm, dry and intact.  No open lesions or rashes. MUSCULOSKELETAL:  No evidence of effusion of his joints, no synovitis noted.  Fairly good range of motion. PSYCH:  He has normal affect.  DATA:  White count of 7.7, hemoglobin 15.1, hematocrit 41.8, platelet count is 175.  Sodium is 139, potassium 3.4, chloride 101, bicarb 26, glucose 91, BUN 17, creatinine 0.82, troponin 0.0.  Chest x-ray reveals low lung volumes, mild bronchitic changes.  His EKG reveals normal sinus tachycardia with rate of 106.  No ST-segment  elevation or depression. Otherwise, normal EKG.  I did review his EKG.  ASSESSMENT/PLAN: 1. Chest pain.  The patient has had greater than 12 hours of     continuous pain.  At no point was he chest pain free and his     troponins so far x2 have been 0.0.  Therefore, I suspect that this     is not a myocardial infarction in nature, but the severity or     intensity of the pain is somewhat concerning.  He is to receive     above 8 mg of morphine and his pain has never been lower than 6/10.     When I evaluated him, he was in quite a great deal of pain.  We     will check a CT scan of his chest to rule out aneurysm, however, I     suspect this will end up being gastrointestinal in nature.  The     patient has been taking 800 mg of ibuprofen three times a day for     some time.  He certainly could have an ulcer or some other     gastrointestinal cause, so once we will rule him out for myocardial     infarction by cardiac enzymes, we will keep on telemetry unit for     now.  We will check the CT and we will continue pain medication and     medication for his gastrointestinal tract.  He so far has had a     gastrointestinal cocktail with no relief,  intravenous Pepcid and     Nexium twice so far today and that has not helped. 2. Right upper quadrant pain on exam.  We will check his LFTs that has     not yet been done or right upper quadrant ultrasound.  Certainly,     he will need to stop his ibuprofen and we will follow. 3. Hypercholesterolemia.  We will continue his statin, and we will     await these results for indication of what to do next.  The patient     is requiring intravenous narcotics to control his pain which we     will continue for now.     Douglas Bottoms, MD     CVC/MEDQ  D:  08/06/2011  T:  08/06/2011  Job:  [id number]  cc:   Douglas Cloud. Kriste Basque, MD  Electronically Signed by Buena Irish MD on 08/06/2011 10:11:08 PM

## 2011-08-07 LAB — BASIC METABOLIC PANEL
Calcium: 10.3 mg/dL (ref 8.4–10.5)
Creatinine, Ser: 0.94 mg/dL (ref 0.50–1.35)
GFR calc non Af Amer: >90 mL/min (ref >90)
Glucose, Bld: 91 mg/dL (ref 70–99)
Sodium: 139 mEq/L (ref 135–145)

## 2011-08-08 NOTE — Discharge Summary (Signed)
  NAME:  Douglas Fletcher, Douglas Fletcher NO.:  [id number]  MEDICAL RECORD NO.:  [id number]  LOCATION:  3705                         FACILITY:  [facility]  PHYSICIAN:  Kathlen Mody, MD       DATE OF BIRTH:  26-Aug-1966  DATE OF ADMISSION:  08/05/2011 DATE OF DISCHARGE:  08/07/2011                              DISCHARGE SUMMARY   DISCHARGE DIAGNOSES: 1. NSAID-induced gastritis. 2. Hypertension. 3. Hyperlipidemia. 4. Anxiety. 5. Osteoarthritis of the knee.  DISCHARGE MEDICATIONS: 1. Protonix 40 mg b.i.d. 2. Zofran 4 mg q.6 hours p.r.n. 3. Tylenol 650 mg q.4 hours p.r.n. 4. Hydrochlorothiazide 25 mg one tablet daily. 5. Simvastatin 40 mg daily. 6. Multivitamin one tablet daily. 7. Sertraline 10 mg daily. 8. Gabapentin 300 mg twice a day. 9. Carafate 1 g 4 times a day for 7 days.  CONSULTATIONS:  None.  PROCEDURES:  None.  PERTINENT LABS:  CBC:  Significant for an MCHC of 36.1.  Basic metabolic panel significant for potassium of 3.4.  On the day of discharge, the patient's basic metabolic panel within normal limits.  Two sets of cardiac enzymes which were negative.  Urine drug screen positive for tetrahydrocannabinol.  RADIOLOGY: 1. The patient had a two-view chest x-ray, which showed mild     bronchitis changes. 2. CT angiogram of the chest shows no PE, no aortic dissection,     bibasilar opacities, atelectasis. 3. Ultrasound of the abdomen is negative.  BRIEF HOSPITAL COURSE:  This is a 45 year old gentleman with history of hypertension, hyperlipidemia, on chronic NSAIDs for osteoarthritis of the knee and was admitted for chest pain, underwent a CT angiogram of the chest to rule out PE versus dissection, which were all negative. The patient has been on chronic NSAIDs for his osteoarthritis most likely what he had was NSAID-induced gastritis.  He was recommended to stop the ibuprofen, recommend to take acetaminophen instead for his osteoarthritis.  He was put on IV  Protonix 40 mg b.i.d. for 24 hours. His epigastric/chest pain has completely resolved.  His symptoms have resolved.  His right upper quadrant pain has resolved.  His ultrasound of abdomen was negative for any acute changes.  Hence, he has been discharged on Protonix, Carafate, and recommended to see his primary care physician in 1-2 weeks and refer him to a gastroenterologist if he has similar symptoms later on for possible EGD.  Hypertension controlled.  Hyperlipidemia.  Continue with simvastatin.  The patient's vitals on the day of discharge were within normal limits. His exam was within normal limits.  He is hemodynamically stable for discharge home.  Follow up with his PCP in 1-2 weeks as needed.          ______________________________ Kathlen Mody, MD     VA/MEDQ  D:  08/07/2011  T:  08/07/2011  Job:  [id number]  Electronically Signed by Kathlen Mody MD on 08/08/2011 10:43:10 PM

## 2011-08-14 ENCOUNTER — Telehealth: Payer: Self-pay | Admitting: Pulmonary Disease

## 2011-08-14 MED ORDER — PANTOPRAZOLE SODIUM 40 MG PO TBEC: 40.0000 mg | DELAYED_RELEASE_TABLET | Freq: Two times a day (BID) | ORAL | Status: DC

## 2011-08-14 NOTE — Telephone Encounter (Signed)
I spoke with pt wife and she states pt was put on protonix 40 mg in the hospital. Pt wife states he takes this 1 bid. Pt wife states they are needing a refill on this. Please advise Dr. Kriste Basque if okay to send refill for pt, thanks  Carver Fila, CMA

## 2011-08-14 NOTE — Telephone Encounter (Signed)
Per SN: ok for generic protonix 40mg  1 po bid # 60 x 5 refills.  Called and spoke with pt.  Pt aware rx sent to pharmacy.

## 2011-08-18 ENCOUNTER — Inpatient Hospital Stay: Payer: BC Managed Care – PPO | Admitting: Adult Health

## 2011-10-06 ENCOUNTER — Telehealth: Payer: Self-pay | Admitting: Pulmonary Disease

## 2011-10-06 MED ORDER — OSELTAMIVIR PHOSPHATE 75 MG PO CAPS: 75.0000 mg | ORAL_CAPSULE | Freq: Two times a day (BID) | ORAL | Status: AC

## 2011-10-06 NOTE — Telephone Encounter (Signed)
Tamiflu 75mg  Twice daily  For 5 days  mucinex DM Twice daily  As needed  Cough/congestion  Fluids and rest  Tylenol As needed   Please contact office for sooner follow up if symptoms do not improve or worsen or seek emergency care

## 2011-10-06 NOTE — Telephone Encounter (Signed)
Spouse says that pt has been coughing for 3 days, mucus is yellow to green in color, body aches from coughing and now has a fever of 102 today. Spouse says she saw TP in the office yesterday for the same thing. She is requesting something be called into his pharmacy. Pls advise. Allergies  Allergen Reactions  . Erythromycin     REACTION: rash/hives, swelling in mouth and throat

## 2011-10-06 NOTE — Telephone Encounter (Signed)
Patient aware of TP recs and prescription has been sent to his pharmacy.. Pt will call if no better or seek emergency help.

## 2011-10-27 ENCOUNTER — Encounter: Payer: Self-pay | Admitting: Pulmonary Disease

## 2012-02-28 ENCOUNTER — Telehealth: Payer: Self-pay | Admitting: Pulmonary Disease

## 2012-02-28 DIAGNOSIS — M25562 Pain in left knee: Secondary | ICD-10-CM

## 2012-02-28 NOTE — Telephone Encounter (Signed)
I spoke with pt and is aware of SN recs. i have placed referral and nothing further was needed

## 2012-02-28 NOTE — Telephone Encounter (Signed)
Per SN---pt will need ortho eval for the left knee pain ASAP.  They will be able to do xrays.    [facility].  thanks

## 2012-02-28 NOTE — Telephone Encounter (Signed)
Called, spoke with pt who states yesterday he was pulling his self up into an attick access because the ladder did not reach all the way up.  When he did this, his left knee popped.  Since then, c/o a throbbing/aching pain and a sharp pain when bending the knee.  Pain is a 5-7 out of 10.  He has been applying ice to the knee when he can and has taken tylenol with some relief.  States he cannot take ibuprofen d/t hital hernia.  He would like to be seen to have this evaluated.  He was last seen by SN on 02/28/11 and has no pending appts.  There are no appts with SN available this week and TP is off the rest of this week.  Dr. Kriste Basque, pls advise. Thank you.

## 2012-03-04 ENCOUNTER — Other Ambulatory Visit: Payer: Self-pay | Admitting: Pulmonary Disease

## 2012-03-07 ENCOUNTER — Other Ambulatory Visit: Payer: Self-pay | Admitting: Pulmonary Disease

## 2012-03-12 ENCOUNTER — Telehealth: Payer: Self-pay | Admitting: Pulmonary Disease

## 2012-03-12 MED ORDER — PANTOPRAZOLE SODIUM 40 MG PO TBEC: 40.0000 mg | DELAYED_RELEASE_TABLET | Freq: Two times a day (BID) | ORAL | Status: DC

## 2012-03-12 MED ORDER — SIMVASTATIN 40 MG PO TABS: 40.0000 mg | ORAL_TABLET | Freq: Every day | ORAL | Status: DC

## 2012-03-12 MED ORDER — HYDROCHLOROTHIAZIDE 25 MG PO TABS: 25.0000 mg | ORAL_TABLET | Freq: Every day | ORAL | Status: DC

## 2012-03-12 NOTE — Telephone Encounter (Signed)
Last ov with SN 5.1.12.  N/s for appt with TP 8.7.12, cancelled appt with TP 10.19.12.  No pending appts at this time.  Called spoke with patient's wife - appt made with SN for 6.25.13 @ 3pm (no sooner available appts).  1 refill given of each verified medication and will refill again if needed closer to the appt.  Pt's wife aware that pt will need to keep this appt in order to receive future refills and verbalized her understanding.  Verified medications sent to [facility] on [address].

## 2012-04-13 ENCOUNTER — Other Ambulatory Visit: Payer: Self-pay | Admitting: Pulmonary Disease

## 2012-04-15 ENCOUNTER — Telehealth: Payer: Self-pay | Admitting: Pulmonary Disease

## 2012-04-15 MED ORDER — SIMVASTATIN 40 MG PO TABS: 40.0000 mg | ORAL_TABLET | Freq: Every day | ORAL | Status: DC

## 2012-04-15 MED ORDER — HYDROCHLOROTHIAZIDE 25 MG PO TABS: 25.0000 mg | ORAL_TABLET | Freq: Every day | ORAL | Status: DC

## 2012-04-15 NOTE — Telephone Encounter (Signed)
Left message that rx's sent to pharmacy .  Pt has appt with Dr Kriste Basque this month

## 2012-04-23 ENCOUNTER — Ambulatory Visit (INDEPENDENT_AMBULATORY_CARE_PROVIDER_SITE_OTHER): Payer: BC Managed Care – PPO | Admitting: Pulmonary Disease

## 2012-04-23 ENCOUNTER — Encounter: Payer: Self-pay | Admitting: Pulmonary Disease

## 2012-04-23 VITALS — BP 120/86 | HR 70 | Temp 97.0°F | Ht 70.0 in | Wt 235.8 lb

## 2012-04-23 DIAGNOSIS — F411 Generalized anxiety disorder: Secondary | ICD-10-CM

## 2012-04-23 DIAGNOSIS — M1990 Unspecified osteoarthritis, unspecified site: Secondary | ICD-10-CM

## 2012-04-23 DIAGNOSIS — R5381 Other malaise: Secondary | ICD-10-CM

## 2012-04-23 DIAGNOSIS — R5383 Other fatigue: Secondary | ICD-10-CM | POA: Insufficient documentation

## 2012-04-23 DIAGNOSIS — N320 Bladder-neck obstruction: Secondary | ICD-10-CM

## 2012-04-23 DIAGNOSIS — N139 Obstructive and reflux uropathy, unspecified: Secondary | ICD-10-CM

## 2012-04-23 DIAGNOSIS — K219 Gastro-esophageal reflux disease without esophagitis: Secondary | ICD-10-CM

## 2012-04-23 DIAGNOSIS — I10 Essential (primary) hypertension: Secondary | ICD-10-CM

## 2012-04-23 DIAGNOSIS — E663 Overweight: Secondary | ICD-10-CM | POA: Insufficient documentation

## 2012-04-23 DIAGNOSIS — E7800 Pure hypercholesterolemia, unspecified: Secondary | ICD-10-CM

## 2012-04-23 NOTE — Progress Notes (Signed)
Subjective:    Patient ID: Douglas Fletcher, male    DOB: 10-02-1966, 46 y.o.   MRN: [id number]  HPI 46 y/o WM, here for a follow up visit>>  ~  Feb 28, 2011:  5 year ROV> I last saw in 2007, has been seen several times by TP in the interim for ear pain, back pain, neck pain, etc...  Apparently still goes to the [facility] where he is service connected for PTSD, and gets meds from them... Presents after tick exposure 2 weeks ago followed by feeling poorly, HA, neck pain, joint pain, balance off, not thinking clearly, decr energy, etc;  Doxycycline 100mg  Bid x10d was called in empirically & already finished> symptoms linger but no fever & no objective findings on exam;  We discussed checking lab work (all neg/ wnl) and Rx w/ Rest, Tylenol, Fluids, & he wants 1 week OOW (Heating & Air)- OK (until 03/06/11)... SEE MEDICAL PROBLEMS BELOW:  ~  April 23, 2012:  76mo ROV & [name]'s CC= no energy, fatigued, not resting well, notes hx bilat ulnar neuropathies w/ prev eval in [name] & Rx w/ some sort of cast + Neurontin for awhile; tried sleeping pill in past but made him groggy & he doesn't want more meds; we reviewed sleep hygiene & rec trial TylenolPM vs Melatonin...    AR> uses Zyrtek OTC as needed for allergy symptoms...    HBP> on HCTZ 25mg /d;  BP= 120/86 and denies CP, palpit, dizzy, SOB, edema;  Not on K supplement w/ K=5.4 today.    CHOL> on Simva40 now, tol well;  FLP shows TCol 183, TG 91, HDL 51, LDL 113    Obesity> wt= 236# down 3# this yr but BMI= 34 & we reviewed diet, exercise, wt reduction program...    GERD> he tells me he is taking Protonix 40mg  bid now; we have no record of GI evaluation here, ?from the [facility]...    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    PTSD/ Anxiety> service connected from his time in [name]; followed by the [facility] & prev on meds but not currently & [facility]... We reviewed prob list, meds, xrays and labs> see below>> LABS 6/13:  FLP- ok x LDL=113 on Simva40;  Chems- wnl;  CBC- wnl;  TSH=2.25;  PSA=0.78;  Testos=372 ([id number])         PROBLEM LIST:  Hx Allergic Rhinitis>  He takes OTC antihist prn...  HBP>  Now states that he is back on the HCTZ-25mg /d, ?restarted by the [facility]? He is vague about the history... ~  5/12:  BP= 122/80 & this was supposedly OFF the HCTZ, weight = 239#, we discussed nosalt, diet+ exercise for wt reduction... 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  HYPERCHOLESTEROLEMIA>  Prev on diet alone, SIMVASTATIN 40mg /d started in 2012... ~  FLP 5/12 showed TChol 209, TG 189, HDL 42, LDL 144 but he says this wasn't fasting... ~  Repeat FLP 5/12 Fasting showed TChol 197,  TG 176, HDL 40, LDL 122... rec get on diet, get wt down... ~  Simva40 started in 2012> FLP 6/13 on Simva40 showed TChol 183, TG 91, HDL 51, LDL 113  OBESITY>  We reviewed diet + exercise recommendations for weight reduction... ~  Prev weights here 208# - 261# during the 2000s... ~  Weight 5/12 = 239# ~  Weight 6/13 = 236#  GERD>  Currently taking PROTONIX 40mg  bid by his hx... ~  We do not have record of any prev GI work-up; ?done by the [name]...   Hx Fatty Liver Disease>  Hx elev LFTs assoc w/ his obesity & felt to represent FLD, advised on need for diet & wt reduction... ~  Labs 5/12 showed norm LFTs w/ SGOT= 22, SGPT= 41 ~  Labs 6/13 showed norm LFTs w/ SGOT= 25, SGPT= 33  Hx Borderline Hypogonadism>  Prev hx borderline Low-T, not placed on therapy & it is unclear what the [place] [facility] has done for further eval or follow up of this problem... ~  6/13:   Presents c/o fatigue> full eval included Testos level = 372 ([phone])  DJD>  He has had on-going complaints of back & neck pain w/ evaluation by his [facility], he does not know results & we do not have prev XRays or reports to review... Pt asked to have data sent to [place]...  Hx RLS>  Prev tried on Reguip Rx...  PTSD & ANXIETY>  Not currently on medication...  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  Health Maintenance> ~  GI:  He is 46 y/o and will require screening colonoscopy at ~48... ~  GU:  He denies GU symptoms and will require screening PSA at ~48... ~  Immunizations:  He has not been getting the yearly Flu vaccines;  ?when last Tetanus shot was given;  No current indication for pre-age 51 Pneumonia vaccine...   Past Surgical History  Procedure Date  . Appendectomy 1980's  . Wisdom teeth surgery     Outpatient Encounter Prescriptions as of 04/23/2012  Medication Sig Dispense Refill  . cetirizine (ZYRTEC) 10 MG tablet Take 10 mg by mouth at bedtime.        . gabapentin (NEURONTIN) 100 MG capsule Take 100 mg by mouth 2 (two) times daily.        . hydrochlorothiazide (HYDRODIURIL) 25 MG tablet Take 1 tablet (25 mg total) by mouth daily.  30 tablet  5  . Misc Natural Products (OSTEO BI-FLEX ADV DOUBLE ST PO) Take 1 tablet by mouth daily.        . Multiple Vitamins-Minerals (PX MENS MULTIVITAMINS PO) Take 1 tablet by mouth daily.        . pantoprazole (PROTONIX) 40 MG tablet Take 1 tablet (40 mg total) by mouth 2 (two) times daily.  60 tablet  0  . predniSONE, Pak, (STERAPRED) 5 MG TABS Take as directed--please give 6 day pack  1 each  0  . simvastatin (ZOCOR) 40 MG tablet Take 1 tablet (40 mg total) by mouth at bedtime.  30 tablet  5  . DISCONTD: ibuprofen (ADVIL,MOTRIN) 800 MG tablet Take 800 mg by mouth every 8 (eight) hours as needed.        Marland Kitchen DISCONTD: omeprazole (PRILOSEC) 20 MG  capsule Take 20 mg by mouth 2 (two) times daily.  Allergies  Allergen Reactions  . Erythromycin     REACTION: rash/hives, swelling in mouth and throat    Review of Systems    Constitutional:  Denies F/C/S, anorexia, unexpected weight change. HEENT:  Sl HA; no visual changes, earache, nasal symptoms, sore throat, hoarseness. Resp:  No cough, sputum, hemoptysis; no SOB, tightness, wheezing. Cardio:  No CP, palpit, DOE, orthopnea, edema. GI:  Denies N/V/D/C or blood in stool; no reflux, abd pain, distention, or gas. GU:  No dysuria, freq, urgency, hematuria, or flank pain. MS:  Denies joint pain, swelling, tenderness, or decr ROM; no neck pain, back pain, etc. Neuro:  No tremors, seizures, dizziness, syncope, weakness, numbness, gait abn. Skin:  No suspicious lesions or skin rash. Heme:  No adenopathy, bruising, bleeding. Psyche: Denies confusion, sleep disturbance, hallucinations, anxiety, depression.   Objective:   Physical Exam     WD, Obese, 46 y/o WM in NAD... Vital Signs:  Reviewed...  General:  Alert & oriented; pleasant & cooperative... HEENT:  Hill/AT, EOM-wnl, PERRLA, Fundi-benign, EACs-clear, TMs-wnl, NOSE-clear, THROAT-clear & wnl. Neck:  Supple w/ full ROM; no JVD; normal carotid impulses w/o bruits; no thyromegaly or nodules palpated; no lymphadenopathy. Chest:  Clear to P & A; without wheezes/ rales/ or rhonchi heard... Heart:  Regular Rhythm; norm S1 & S2 without murmurs/ rubs/ or gallops detected... Abdomen:  Soft & nontender; normal bowel sounds; no organomegaly or masses palpated... Ext:  Normal ROM; without deformities or arthritic changes; no varicose veins, venous insuffic, or edema;  Pulses intact w/o bruits... Neuro:  CNs II-XII intact; motor testing normal; sensory testing normal; gait normal & balance OK... Derm:  No lesions noted; no rash etc... Lymph:  No cervical, supraclavicular, axillary, or inguinal adenopathy palpated...    Assessment &  Plan:    HBP>  He is back on HCTZ 25mg /d, he knows that he needs no salt & lose wt!!!  CHOL>  Started on Simva40 this past yr (?from the [place]); FLP is improved but LDL not at goal =113, rec better diet...  OBESITY>  Weight reduction through vigorous diet efforts and exercise...  GERD>  We discussed the use of OTC PPI vs H2Blockers prn...  DJD>  Wt reduction is very important here too  PTSD/ Anxiety>  Per his [facility] physicians, he is not on anxiolytic meds...   Patient's Medications  New Prescriptions   No medications on file  Previous Medications   CETIRIZINE (ZYRTEC) 10 MG TABLET    Take 10 mg by mouth at bedtime.     GABAPENTIN (NEURONTIN) 100 MG CAPSULE    Take 100 mg by mouth 2 (two) times daily.     HYDROCHLOROTHIAZIDE (HYDRODIURIL) 25 MG TABLET    Take 1 tablet (25 mg total) by mouth daily.   MISC NATURAL PRODUCTS (OSTEO BI-FLEX ADV DOUBLE ST PO)    Take 1 tablet by mouth daily.     MULTIPLE VITAMINS-MINERALS (PX MENS MULTIVITAMINS PO)    Take 1 tablet by mouth daily.     PANTOPRAZOLE (PROTONIX) 40 MG TABLET    Take 1 tablet (40 mg total) by mouth 2 (two) times daily.   PREDNISONE, PAK, (STERAPRED) 5 MG TABS    Take as directed--please give 6 day pack   SIMVASTATIN (ZOCOR) 40 MG TABLET    Take 1 tablet (40 mg total) by mouth at bedtime.  Modified Medications   No medications on file  Discontinued Medications   IBUPROFEN (ADVIL,MOTRIN) 800 MG TABLET    Take 800 mg by  mouth every 8 (eight) hours as needed.     OMEPRAZOLE (PRILOSEC) 20 MG CAPSULE    Take 20 mg by mouth 2 (two) times daily.

## 2012-04-23 NOTE — Patient Instructions (Addendum)
Today we updated your med list in our EPIC system...    Continue your current medications the same...  Today we did your follow up blood work to check various parameters to assess your fatigue...    We will call you w/ the results...  In the meanwhile> stay as active as possible & strive for a good night's sleep...  Call for any questions.Marland KitchenMarland Kitchen

## 2012-04-29 ENCOUNTER — Other Ambulatory Visit (INDEPENDENT_AMBULATORY_CARE_PROVIDER_SITE_OTHER): Payer: BC Managed Care – PPO

## 2012-04-29 DIAGNOSIS — I10 Essential (primary) hypertension: Secondary | ICD-10-CM

## 2012-04-29 DIAGNOSIS — E7800 Pure hypercholesterolemia, unspecified: Secondary | ICD-10-CM

## 2012-04-29 DIAGNOSIS — N139 Obstructive and reflux uropathy, unspecified: Secondary | ICD-10-CM

## 2012-04-29 DIAGNOSIS — F411 Generalized anxiety disorder: Secondary | ICD-10-CM

## 2012-04-29 DIAGNOSIS — N320 Bladder-neck obstruction: Secondary | ICD-10-CM

## 2012-04-29 DIAGNOSIS — R5381 Other malaise: Secondary | ICD-10-CM

## 2012-04-29 DIAGNOSIS — R5383 Other fatigue: Secondary | ICD-10-CM

## 2012-04-29 LAB — TSH: TSH: 2.25 u[IU]/mL (ref 0.35–5.50)

## 2012-04-29 LAB — BASIC METABOLIC PANEL
BUN: 18 mg/dL (ref 6–23)
Calcium: 9.9 mg/dL (ref 8.4–10.5)
Creatinine, Ser: 0.9 mg/dL (ref 0.4–1.5)
GFR: 99.49 mL/min (ref >60.00)
Glucose, Bld: 95 mg/dL (ref 70–99)
Sodium: 141 mEq/L (ref 135–145)

## 2012-04-29 LAB — HEPATIC FUNCTION PANEL
AST: 25 U/L (ref 0–37)
Albumin: 4.4 g/dL (ref 3.5–5.2)
Alkaline Phosphatase: 66 U/L (ref 39–117)
Bilirubin, Direct: 0.1 mg/dL (ref 0.0–0.3)
Total Protein: 7.1 g/dL (ref 6.0–8.3)

## 2012-04-29 LAB — CBC WITH DIFFERENTIAL/PLATELET
Basophils Absolute: 0 10*3/uL (ref 0.0–0.1)
Eosinophils Absolute: 0.2 10*3/uL (ref 0.0–0.7)
Lymphocytes Relative: 37.6 % (ref 12.0–46.0)
MCHC: 35.5 g/dL (ref 30.0–36.0)
Monocytes Relative: 6.3 % (ref 3.0–12.0)
Neutrophils Relative %: 53.2 % (ref 43.0–77.0)
Platelets: 225 10*3/uL (ref 150.0–400.0)
RDW: 12.8 % (ref 11.5–14.6)

## 2012-04-29 LAB — PSA: PSA: 0.78 ng/mL (ref 0.10–4.00)

## 2012-04-29 LAB — LIPID PANEL
HDL: 51.4 mg/dL (ref >39.00)
Triglycerides: 91 mg/dL (ref 0.0–149.0)

## 2012-05-08 ENCOUNTER — Telehealth: Payer: Self-pay | Admitting: Pulmonary Disease

## 2012-05-08 MED ORDER — NEOMYCIN-POLYMYXIN-GRAMICIDIN 1.75-10000-.025 OP SOLN: OPHTHALMIC | Status: AC

## 2012-05-08 NOTE — Telephone Encounter (Signed)
Spoke with pt's wife who states their grandson has pink eye.  This am pt woke up with green drainage from eyes and they are pink.  Requesting rx -- Dr. Kriste Basque, pls advise. Thank you.  [facility]  Allerigies Verified:  Allergies  Allergen Reactions  . Erythromycin     REACTION: rash/hives, swelling in mouth and throat    Last OV 04/23/12 with Dr. Kriste Basque

## 2012-05-08 NOTE — Telephone Encounter (Signed)
Called # provided above - received msg stating "the person you are calling is unavailable at this time. pls try your call again later."  No option to leave msg - wcb

## 2012-05-08 NOTE — Telephone Encounter (Signed)
Called and spoke with pts wife and she is aware of SN recs of neosporin eye drops that have been sent to the pharmacy.  Nothing further needed.

## 2012-05-09 ENCOUNTER — Other Ambulatory Visit: Payer: Self-pay | Admitting: Pulmonary Disease

## 2012-09-12 ENCOUNTER — Telehealth: Payer: Self-pay | Admitting: Pulmonary Disease

## 2012-09-12 MED ORDER — AMOXICILLIN-POT CLAVULANATE 875-125 MG PO TABS: 1.0000 | ORAL_TABLET | Freq: Two times a day (BID) | ORAL | Status: DC

## 2012-09-12 NOTE — Telephone Encounter (Signed)
Rx was sent to pharm LM with family member letting them know recs

## 2012-09-12 NOTE — Telephone Encounter (Signed)
Spoke with pt Pt c/o prod cough with green sputum, PND, and green nasal d/c x 1 wk Denies any f/c/s, SOB, CP Taking robitussin w/o relief Last ov with SN 04/23/12 Allergies  Allergen Reactions  . Erythromycin     REACTION: rash/hives, swelling in mouth and throat

## 2012-09-12 NOTE — Telephone Encounter (Signed)
Per SN---ok to call in augmentin 875 mg  #14  1 po bid , mucinex 600 mg 2 po bid with plenty of fluids, align 1 daily.  Nothing further is needed.

## 2012-10-02 ENCOUNTER — Other Ambulatory Visit: Payer: Self-pay | Admitting: Pulmonary Disease

## 2012-10-03 ENCOUNTER — Ambulatory Visit: Payer: BC Managed Care – PPO | Admitting: Adult Health

## 2012-10-04 ENCOUNTER — Encounter: Payer: Self-pay | Admitting: Adult Health

## 2012-10-04 ENCOUNTER — Other Ambulatory Visit: Payer: Self-pay | Admitting: Pulmonary Disease

## 2012-10-04 ENCOUNTER — Ambulatory Visit (INDEPENDENT_AMBULATORY_CARE_PROVIDER_SITE_OTHER): Payer: BC Managed Care – PPO | Admitting: Adult Health

## 2012-10-04 ENCOUNTER — Other Ambulatory Visit: Payer: BC Managed Care – PPO

## 2012-10-04 VITALS — BP 114/70 | HR 76 | Temp 98.9°F | Ht 70.0 in | Wt 235.8 lb

## 2012-10-04 DIAGNOSIS — J039 Acute tonsillitis, unspecified: Secondary | ICD-10-CM

## 2012-10-04 LAB — BETA STREP SCREEN: Streptococcus, Group A Screen (Direct): NEGATIVE

## 2012-10-04 MED ORDER — CEFDINIR 300 MG PO CAPS: 300.0000 mg | ORAL_CAPSULE | Freq: Two times a day (BID) | ORAL | Status: AC

## 2012-10-04 MED ORDER — GABAPENTIN 100 MG PO CAPS: 100.0000 mg | ORAL_CAPSULE | Freq: Three times a day (TID) | ORAL | Status: DC

## 2012-10-04 MED ORDER — PREDNISONE 10 MG PO TABS: ORAL_TABLET | ORAL | Status: DC

## 2012-10-04 NOTE — Patient Instructions (Addendum)
Omnicef 300mg  Twice daily  For 7 days  Salt water gargles As needed   Prednisone taper over next week.  I will call with lab results.  Fluids and rest  Tylenol As needed   Please contact office for sooner follow up if symptoms do not improve or worsen or seek emergency care

## 2012-10-04 NOTE — Telephone Encounter (Signed)
Pt seen by TP for sick visit.  Pt stated that at last ov w/ SN 03/2012 it was mentioned that his gabapentin 100mg  would need to be increased from twice daily.    6.25.13 ov w/ SN: DJD/ RLS> on Neurontin [facility]mg Bid from [facility]who, [facility], he is quite vague...  Per TP: ok to increase gabapentin from 100mg  twice daily, to three times daily.  Rx sent to verified pharmacy.

## 2012-10-04 NOTE — Assessment & Plan Note (Signed)
Slow to resolve tonsillitis  Check strep   Plan Omnicef 300mg  Twice daily  For 7 days  Salt water gargles As needed   Prednisone taper over next week.  I will call with lab results.  Fluids and rest  Tylenol As needed   Please contact office for sooner follow up if symptoms do not improve or worsen or seek emergency care

## 2012-10-04 NOTE — Progress Notes (Signed)
Subjective:    Patient ID: Douglas Fletcher, male    DOB: 11-22-1965, 46 y.o.   MRN: [id number]  HPI 46 y/o WM  ~  Feb 28, 2011:  5 year ROV> I last saw in 2007, has been seen several times by TP in the interim for ear pain, back pain, neck pain, etc...  Apparently still goes to the [facility] ([place]) where he is service connected for PTSD, and gets meds from them... Presents after tick exposure 2 weeks ago followed by feeling poorly, HA, neck pain, joint pain, balance off, not thinking clearly, decr energy, etc;  Doxycycline 100mg  Bid x10d was called in empirically & already finished> symptoms linger but no fever & no objective findings on exam;  We discussed checking lab work (all neg/ wnl) and Rx w/ Rest, Tylenol, Fluids, & he wants 1 week OOW (Heating & Air)- OK (until 03/06/11)... SEE MEDICAL PROBLEMS BELOW:  ~  April 23, 2012:  19mo ROV & [name]'s CC= no energy, fatigued, not resting well, notes hx bilat ulnar neuropathies w/ prev eval in [place] & Rx w/ some sort of cast + Neurontin for awhile; tried sleeping pill in past but made him groggy & he doesn't want more meds; we reviewed sleep hygiene & rec trial TylenolPM vs Melatonin...    AR> uses Zyrtek OTC as needed for allergy symptoms...    HBP> on HCTZ 25mg /d;  BP= 120/86 and denies CP, palpit, dizzy, SOB, edema;  Not on K supplement w/ K=5.4 today.    CHOL> on Simva40 now, tol well;  FLP shows TCol 183, TG 91, HDL 51, LDL 113    Obesity> wt= 236# down 3# this yr but BMI= 34 & we reviewed diet, exercise, wt reduction program...    GERD> he tells me he is taking Protonix 40mg  bid now; we have no record of GI evaluation here, ?from the [facility]...    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    Hx borderline Low-T> he tells me diagnosed & treated at the [facility]; not currently on meds & we will check Testos level w/ his fatigue=>372    DJD/ RLS> on Neurontin [facility]mg Bid [facility], he  is quite vague...    PTSD/ Anxiety> service connected from his time in [name]; followed by the [facility] & prev on meds but not currently & [facility]... We reviewed prob list, meds, xrays and labs> see below>> LABS 6/13:  FLP- ok x LDL=113 on Simva40;  Chems- wnl;  CBC- wnl;  TSH=2.25;  PSA=0.78;  Testos=372 ([phone])  10/04/2012 Acute OV  Complains of swollen tonsils, white "puss-pockets" in back of throat, head congestion/pressure x2-3 weeks  - reports symptoms improved with augmentin, finished this 6 days ago Throat is very sore .  Using salt water gargles without much help.  No otc used.           PROBLEM LIST:  Hx Allergic Rhinitis>  He takes OTC antihist prn...  HBP>  Now states that he is back on the HCTZ-25mg /d, ?restarted by the [facility]? He is vague about the history... ~  5/12:  BP= 122/80 & this was supposedly OFF the HCTZ, weight = 239#, we discussed nosalt, diet+ exercise for wt reduction... 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  HYPERCHOLESTEROLEMIA>  Prev on diet alone, SIMVASTATIN 40mg /d started in 2012... ~  FLP 5/12 showed TChol 209, TG 189, HDL 42, LDL 144 but he says this wasn't fasting... ~  Repeat FLP 5/12 Fasting showed TChol 197, TG 176, HDL 40, LDL 122... rec get on diet, get wt down... ~  Simva40 started in 2012> FLP 6/13 on Simva40 showed TChol 183, TG 91, HDL 51, LDL 113  OBESITY>  We reviewed diet + exercise recommendations for weight reduction... ~  Prev weights here 208# - 261# during the 2000s... ~  Weight 5/12 = 239# ~  Weight 6/13 = 236#  GERD>  Currently taking PROTONIX 40mg  bid by his hx... ~  We do not have record of any prev GI work-up; ?done by the [facility]...   Hx Fatty Liver Disease>  Hx elev LFTs assoc w/ his obesity & felt to represent FLD, advised on need for diet & wt reduction... ~  Labs 5/12 showed norm LFTs w/ SGOT= 22,  SGPT= 41 ~  Labs 6/13 showed norm LFTs w/ SGOT= 25, SGPT= 33  Hx Borderline Hypogonadism>  Prev hx borderline Low-T, not placed on therapy & it is unclear what the [place] clinic has done for further eval or follow up of this problem... ~  6/13:  Presents c/o fatigue> full eval included Testos level = 372 (350-890)  DJD>  He has had on-going complaints of back & neck pain w/ evaluation [facility], he does not know results & we do not have prev XRays or reports to review... Pt asked to have data sent to [place]...  Hx RLS>  Prev tried on Reguip Rx...  PTSD & ANXIETY>  Not currently on medication...  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  Health Maintenance> ~  GI:  He is 46 y/o and will require screening colonoscopy at ~48... ~  GU:  He denies GU symptoms and will require screening PSA at ~48... ~  Immunizations:  He has not been getting the yearly Flu vaccines;  ?when last Tetanus shot was given;  No current indication for pre-age 35 Pneumonia vaccine...   Past Surgical History  Procedure Date  . Appendectomy 1980's  . Wisdom teeth surgery     Outpatient Encounter Prescriptions as of 10/04/2012  Medication Sig Dispense Refill  . cetirizine (ZYRTEC) 10 MG tablet Take 10 mg by mouth at bedtime.        . gabapentin (NEURONTIN) 100 MG capsule Take 100 mg by mouth 2 (two) times daily.        . hydrochlorothiazide (HYDRODIURIL) 25 MG tablet take 1 tablet by mouth once daily  30 tablet  5  . Misc Natural Products (OSTEO BI-FLEX ADV DOUBLE ST PO) Take 1 tablet by mouth daily.        . Multiple Vitamins-Minerals (PX MENS MULTIVITAMINS PO) Take 1 tablet by mouth daily.        Marland Kitchen neomycin-polymyxin-gramicidin (NEOSPORIN) ophthalmic solution 1-2 drops in affected eye every 6 hours  10 mL  0  . pantoprazole (PROTONIX) 40 MG tablet take 1 tablet by mouth twice a day  60 tablet  11  . simvastatin  (ZOCOR) 40 MG tablet Take 1 tablet (40 mg total) by mouth at bedtime.  30 tablet  5  . [DISCONTINUED] amoxicillin-clavulanate (AUGMENTIN) 875-125 MG per tablet Take 1 tablet by mouth  2 (two) times daily.  14 tablet  0  . [DISCONTINUED] hydrochlorothiazide (HYDRODIURIL) 25 MG tablet Take 1 tablet (25 mg total) by mouth daily.  30 tablet  5  . [DISCONTINUED] predniSONE, Pak, (STERAPRED) 5 MG TABS Take as directed--please give 6 day pack  1 each  0    Allergies  Allergen Reactions  . Erythromycin     REACTION: rash/hives, swelling in mouth and throat    Review of Systems    Constitutional:  Denies F/C/S, anorexia, unexpected weight change. HEENT:  no visual changes, earache,  Resp:  No hemoptysis; no SOB, tightness, wheezing. Cardio:  No CP, palpit, DOE, orthopnea, edema. GI:  Denies N/V/D/C or blood in stool; no reflux, abd pain, distention, or gas. GU:  No dysuria, freq, urgency, hematuria, or flank pain. MS:  Denies joint pain, swelling, tenderness, or decr ROM; no neck pain, back pain, etc. Neuro:  No tremors, seizures, dizziness, syncope, weakness, numbness, gait abn. Skin:  No suspicious lesions or skin rash. Heme:  No adenopathy, bruising, bleeding. Psyche: Denies confusion, sleep disturbance, hallucinations, anxiety, depression.   Objective:   Physical Exam     WD, Obese, 46 y/o WM in NAD... Vital Signs:  Reviewed...  General:  Alert & oriented; pleasant & cooperative... HEENT:  [place]/AT,  EACs-clear, TMs-wnl, NOSE-clear, THROAT-mild redness, tonsils 1+  Neck:  Supple w/ full ROM; no JVD; normal carotid impulses w/o bruits; no thyromegaly or nodules palpated; no lymphadenopathy. Chest:  Clear to P & A; without wheezes/ rales/ or rhonchi heard... Heart:  Regular Rhythm; norm S1 & S2 without murmurs/ rubs/ or gallops detected... Abdomen:  Soft & nontender; normal bowel sounds; no organomegaly or masses palpated... Ext:  Normal ROM; without deformities or arthritic changes; no  varicose veins, venous insuffic, or edema;  Pulses intact w/o bruits... Neuro  gait normal & balance OK... Derm:  No lesions noted; no rash etc...    Assessment & Plan:

## 2012-10-09 ENCOUNTER — Telehealth: Payer: Self-pay | Admitting: Pulmonary Disease

## 2012-10-09 MED ORDER — FIRST-DUKES MOUTHWASH MT SUSP: OROMUCOSAL | Status: AC

## 2012-10-09 MED ORDER — MOXIFLOXACIN HCL 400 MG PO TABS: 400.0000 mg | ORAL_TABLET | Freq: Every day | ORAL | Status: AC

## 2012-10-09 MED ORDER — METHYLPREDNISOLONE 4 MG PO KIT: PACK | ORAL | Status: AC

## 2012-10-09 NOTE — Telephone Encounter (Signed)
Per SN---ok to call in MMW  4 oz  1 tsp gargle and swallow four times daily as needed, mucinex 2 po bid with plenty of fluids, avelox 400 mg  #7  1 daily, take align once daily while on the abx, medrol dose pak   #16   1 po bid x 4 days, 1 daily x 4 days, 1/2 daily x 4 days, 1/2 tab every other day until gone.  thanks

## 2012-10-09 NOTE — Telephone Encounter (Signed)
Called spoke with patient's wife, advised of SN's recs as stated below.  Pt's wife wrote these instructions down and verbalized her understanding.  She was also made aware of pt's 12.6.13 lab results.  Rx's telephoned to [facility] to pharmacist Darl Pikes.  MAR updated.  Pt's wife advised to call back if pt's symptoms do not improve or worsen.

## 2012-10-09 NOTE — Telephone Encounter (Signed)
Last OV on 10-04-12 with TP. Pt called on 09-12-12 with c/o sore throat, cough, congestion and was prescribed augmentin x 7 days. Pt then came in for OV on 12-6 because no better. He was given Omnicef 300mg  Twice daily For 7 days  Salt water gargles As needed and Prednisone taper over next week.  Pt has finished the pred taper, even though is was for 8 days. Per pt spouse pt has 2 days left of abx. Pt spouse states that the pt is actually worse since OV. She states that his throat is still very sore, sinus congestion is worse, and now he has a productive cough with green mucus and blowing green mucus from sinuses. Per pt spouse this is new since OV. Please advise. Douglas Fletcher, CMA Allergies  Allergen Reactions  . Erythromycin     REACTION: rash/hives, swelling in mouth and throat

## 2012-10-10 NOTE — Progress Notes (Signed)
Quick Note:  Pt wife aware per 12.11.13 phone note. ______

## 2012-10-31 ENCOUNTER — Other Ambulatory Visit: Payer: Self-pay | Admitting: Pulmonary Disease

## 2012-11-13 ENCOUNTER — Telehealth: Payer: Self-pay | Admitting: Pulmonary Disease

## 2012-11-13 NOTE — Telephone Encounter (Signed)
I spoke with spouse. She stated pt fell at work this morning. He was coming down the ladder and missed last step. [place] Seat and hurt his left knee. It's swollen and is very painful.   Per leigh pt needs to get in and see his ortho doctor or go to UC.  I called spouse back and made her aware of this. She voiced her understanding. Nothing further was needed

## 2013-01-31 ENCOUNTER — Other Ambulatory Visit: Payer: Self-pay | Admitting: Pulmonary Disease

## 2013-01-31 ENCOUNTER — Other Ambulatory Visit: Payer: Self-pay | Admitting: Adult Health

## 2013-02-07 IMAGING — US US ABDOMEN COMPLETE
1 series · 14 of 25 positions shown · non-contrast
Comparison: 12/10/2009

CLINICAL DATA: Right upper quadrant pain, epigastric pain

ABDOMINAL ULTRASOUND COMPLETE

[Series 1: us abdomen complete · 0.35mm/px · 14 of 59 slices shown]
[im 1/59]
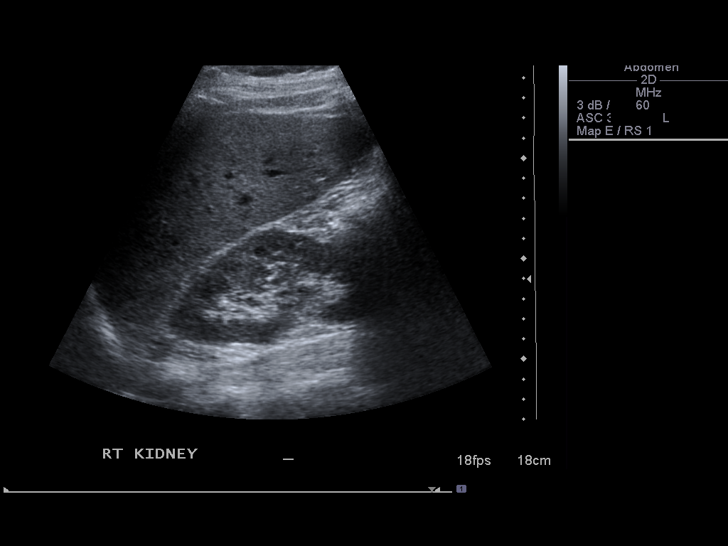
[im 5/59]
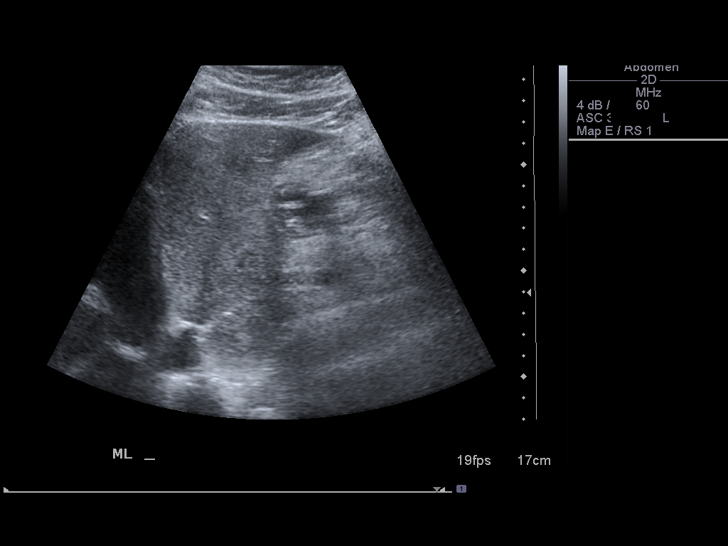
[im 10/59]
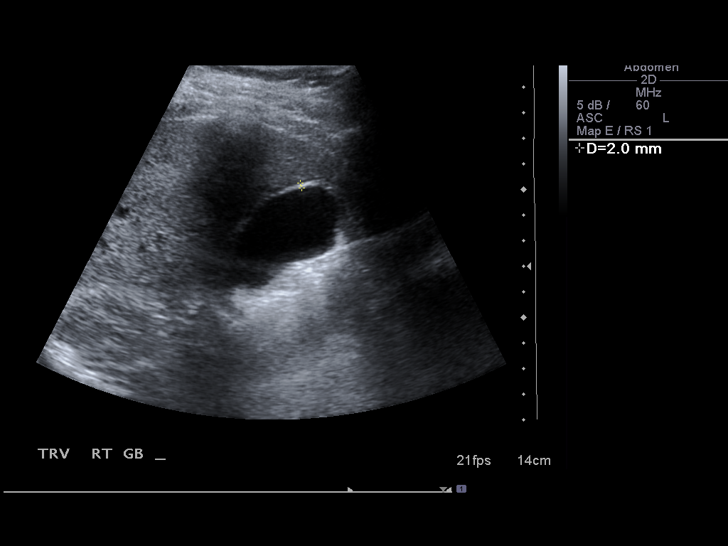
[im 15/59]
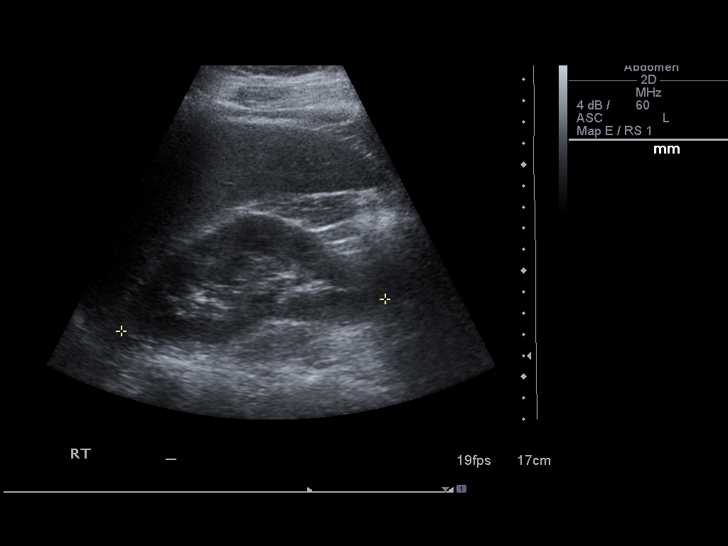
[im 20/59]
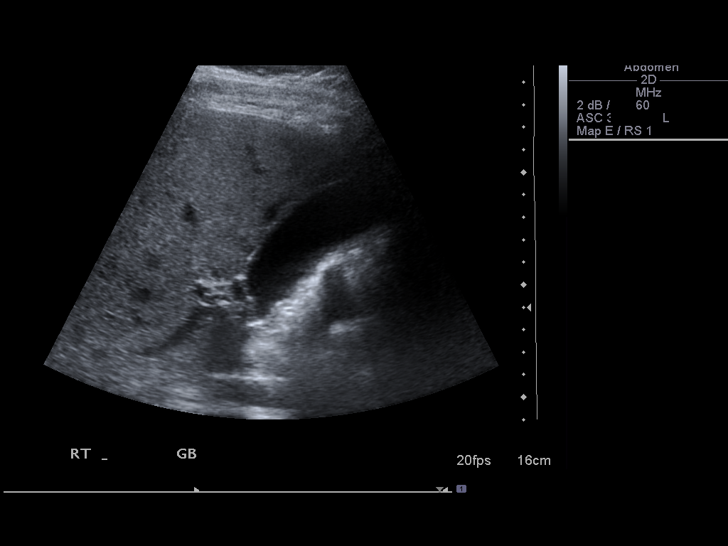
[im 22/59]
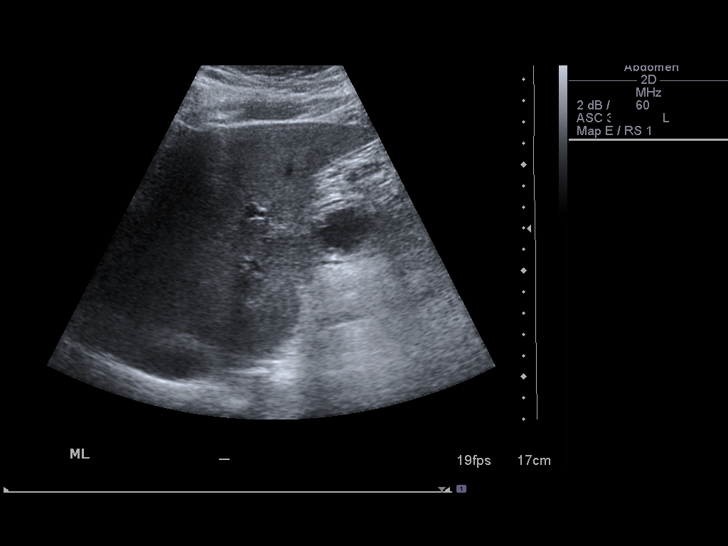
[im 27/59]
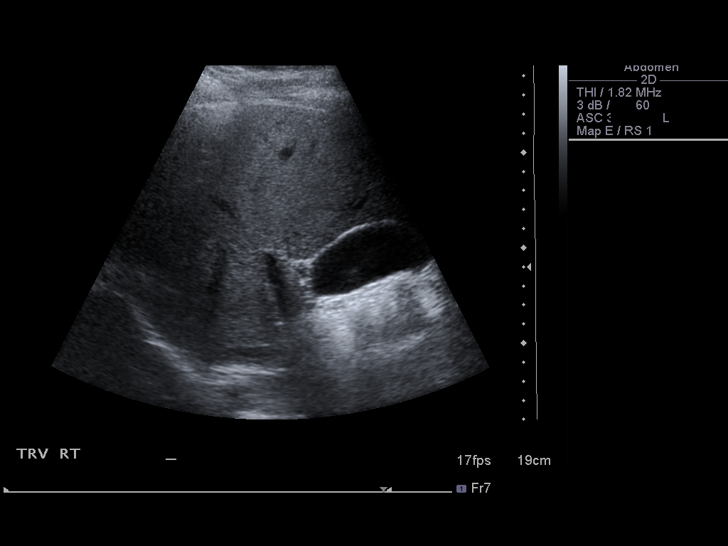
[im 32/59]
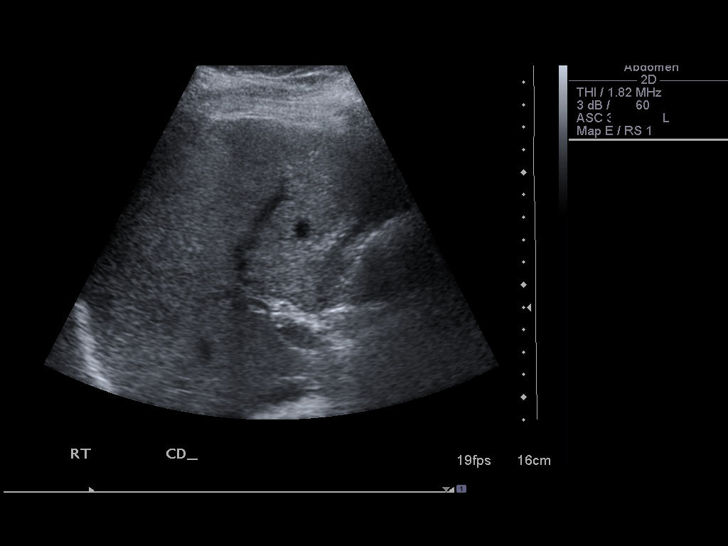
[im 37/59]
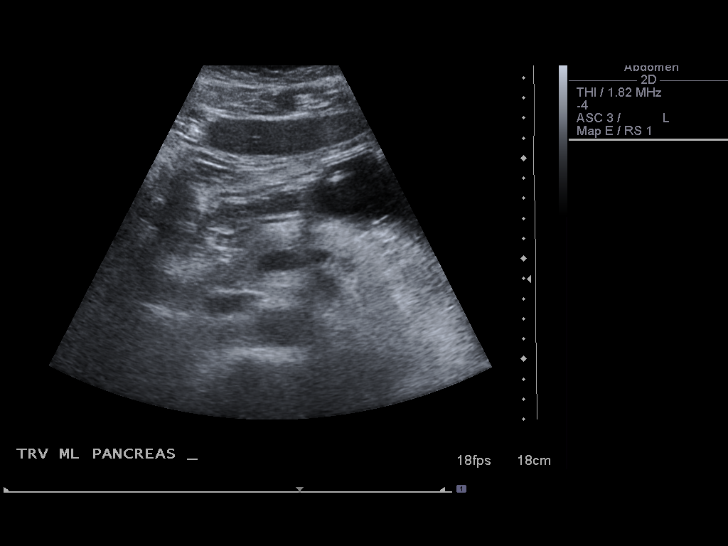
[im 39/59]
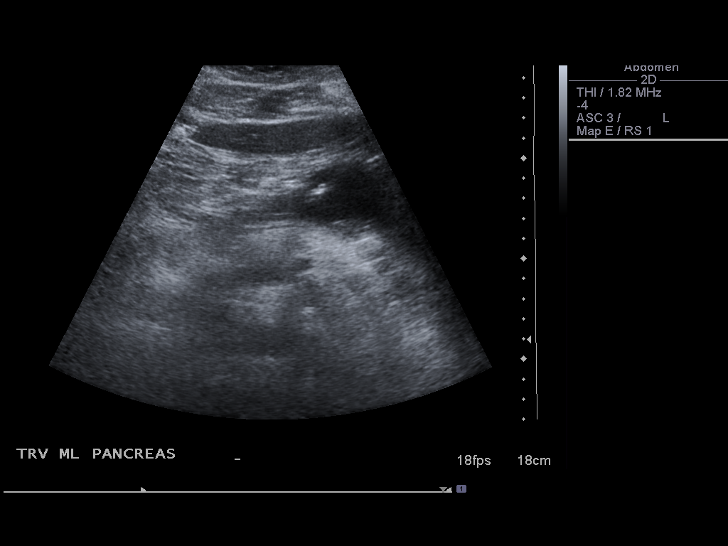
[im 44/59]
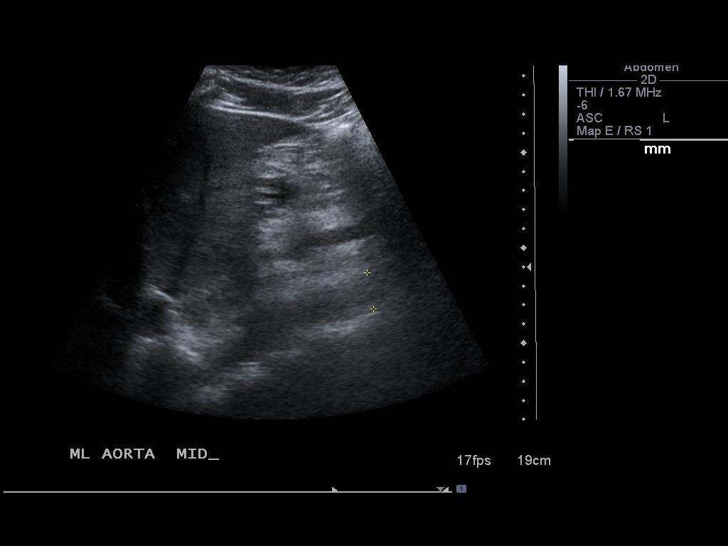
[im 49/59]
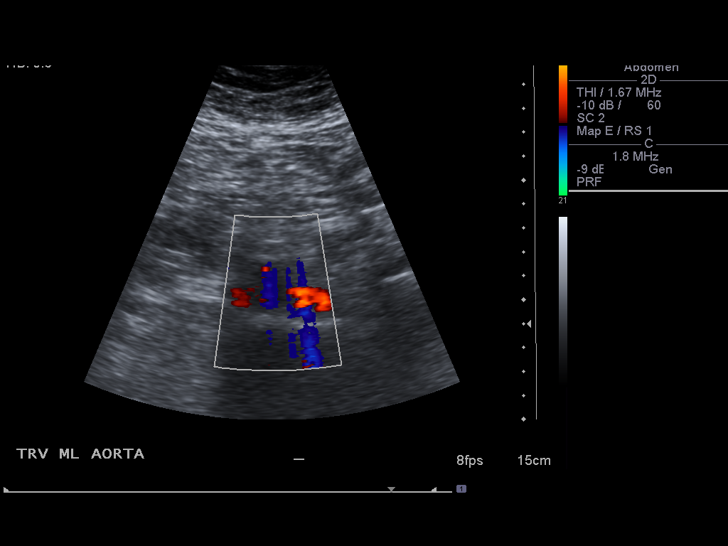
[im 54/59]
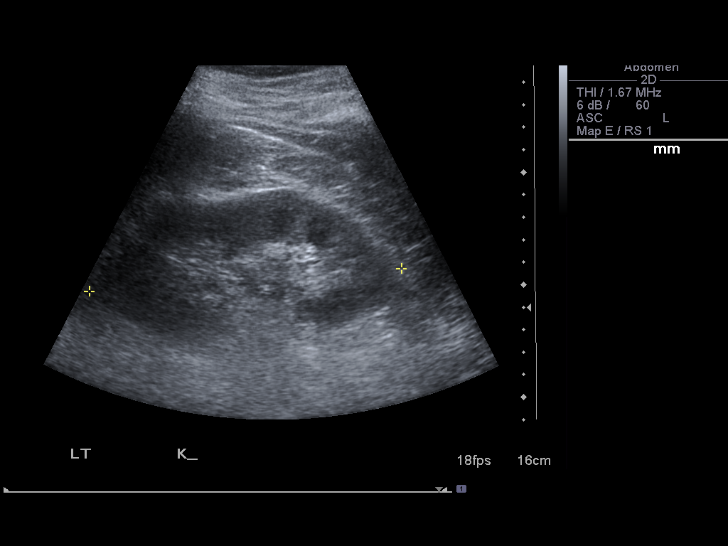
[im 59/59]
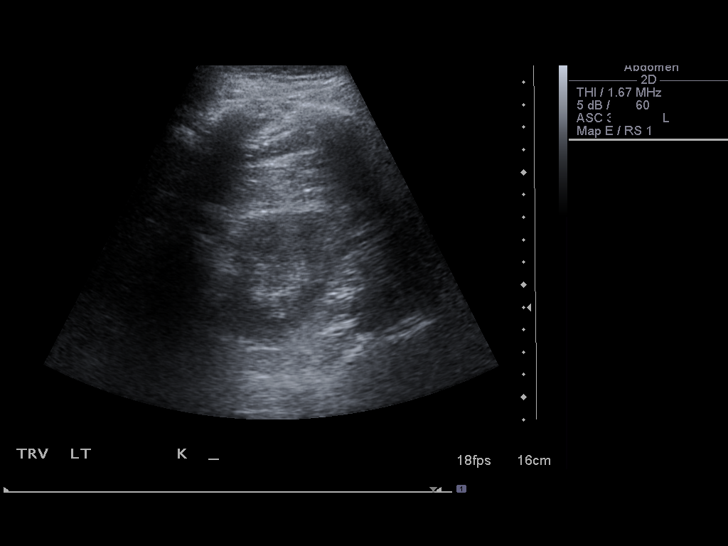

[14 of 25 positions shown; findings below may reference images not displayed]

FINDINGS: Gallbladder:  No gallstones, gallbladder wall thickening, or
pericholecystic fluid.Negative Murphy's sign.  Incidental echogenic
nonshadowing focus along the dependent gallbladder wall suspicious
for a small polyp.

Common Bile Duct:  Within normal limits in caliber.

Liver: No focal mass lesion identified.  Within normal limits in
parenchymal echogenicity.

IVC:  Appears normal.

Pancreas:  No abnormality identified.

Spleen:  Within normal limits in size and echotexture.

Right kidney:  Normal in size and parenchymal echogenicity.  No
evidence of mass or hydronephrosis.

Left kidney:  Normal in size and parenchymal echogenicity.  No
evidence of mass or hydronephrosis.

Abdominal Aorta:  No aneurysm identified.
IMPRESSION: Negative abdominal ultrasound.

## 2013-02-07 IMAGING — CT CT ANGIO CHEST
2 of 6 series · 19 of 46 positions shown · IV contrast (APPLIED)
Comparison: 08/05/2011 radiograph

CLINICAL DATA: Chest pain

CT ANGIOGRAPHY CHEST WITH CONTRAST
TECHNIQUE: Multidetector CT imaging of the chest was performed
using the standard protocol during bolus administration of
intravenous contrast.  Multiplanar CT image reconstructions
including MIPs were obtained to evaluate the vascular anatomy.
Contrast: 100mL OMNIPAQUE IOHEXOL 350 MG/ML IV SOLN

[Series 4: dissection 2.0 st · axial · 0.73mm/px · z∈[-254,-20]mm · 16 of 129 slices shown]
[im 6/129  lung]
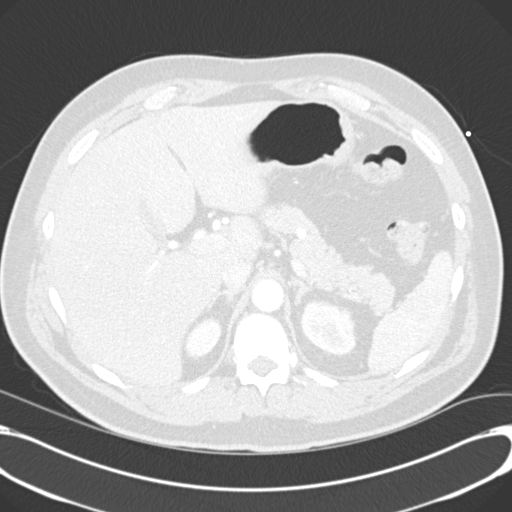
[im 12/129  soft-tissue]
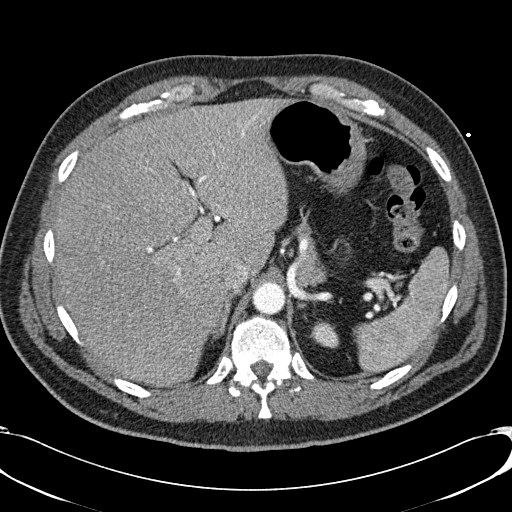
[im 24/129  lung]
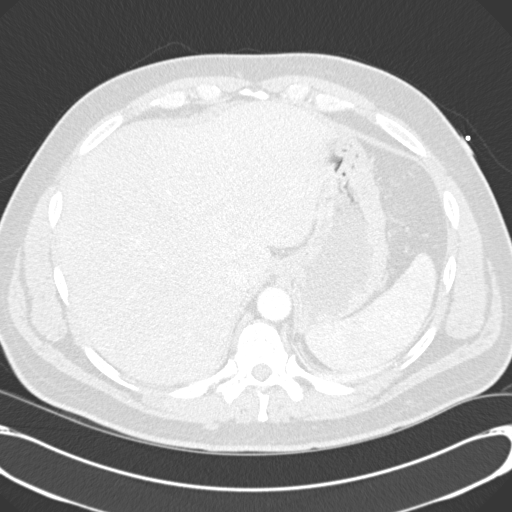
[im 30/129  soft-tissue]
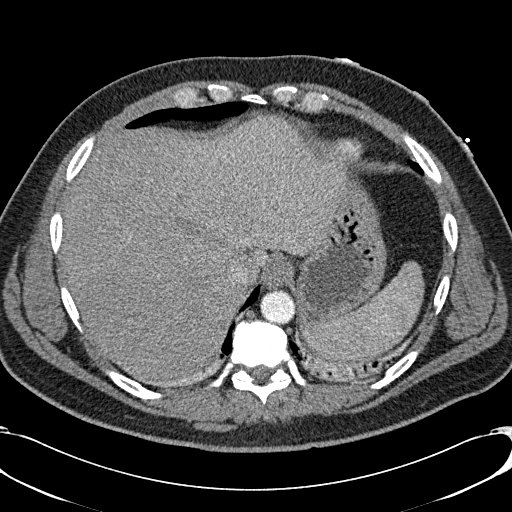
[im 35/129  lung]
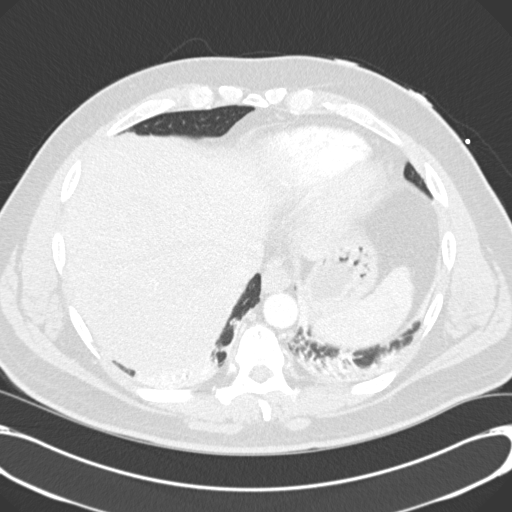
[im 47/129  soft-tissue]
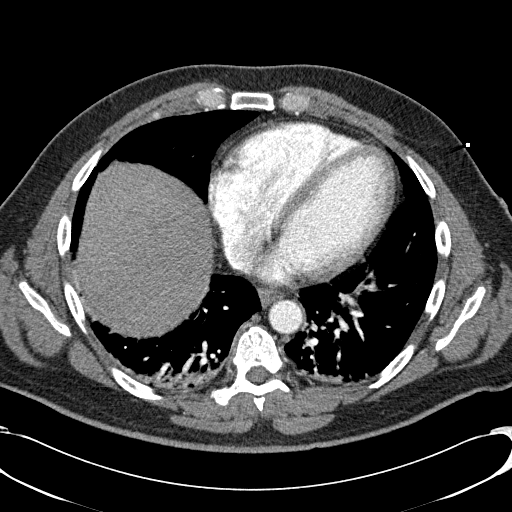
[im 53/129  lung]
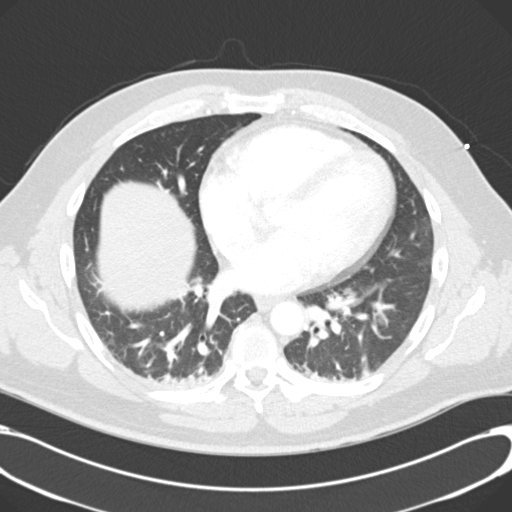
[im 59/129  soft-tissue]
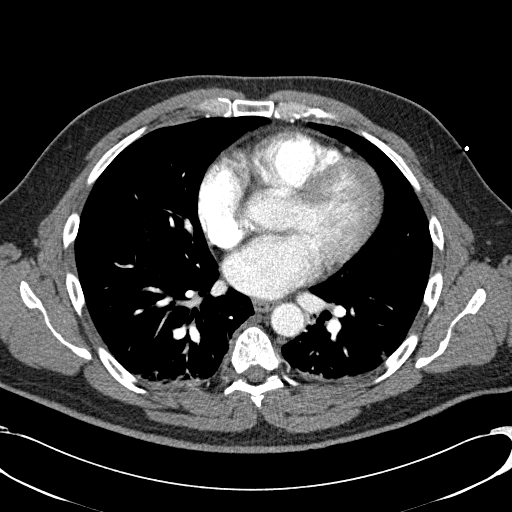
[im 70/129  lung]
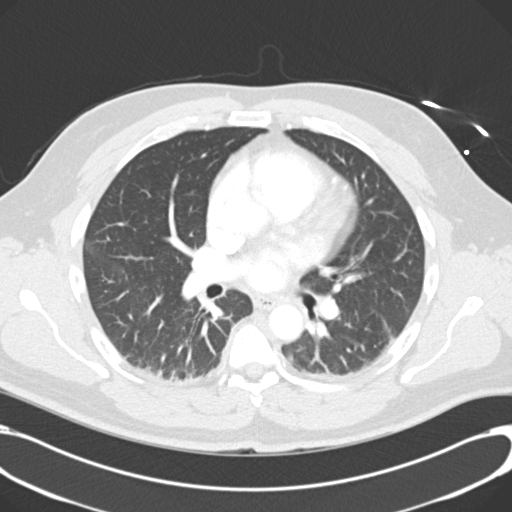
[im 76/129  soft-tissue]
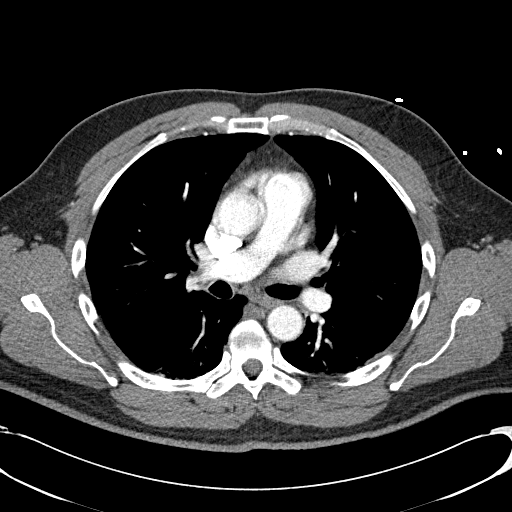
[im 82/129  lung]
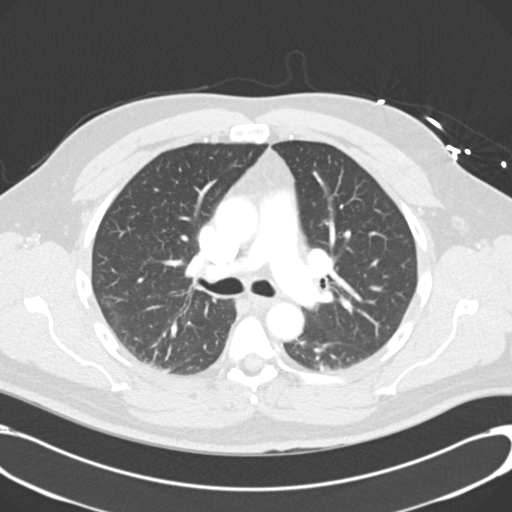
[im 94/129  soft-tissue]
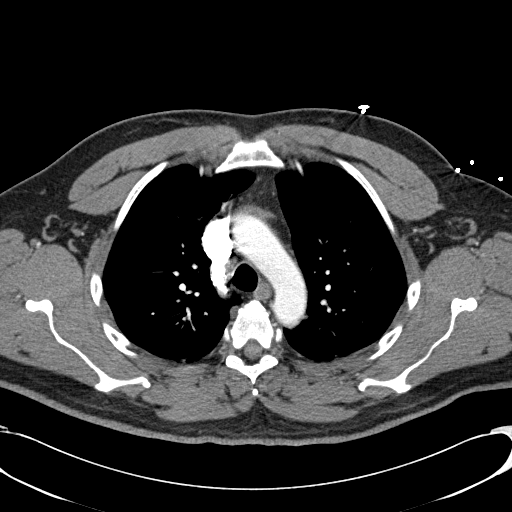
[im 99/129  lung]
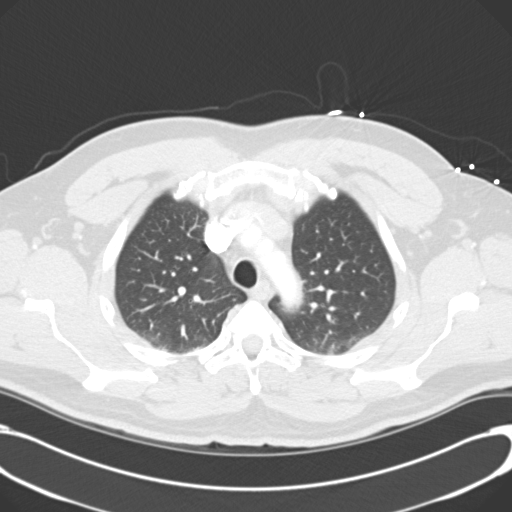
[im 105/129  soft-tissue]
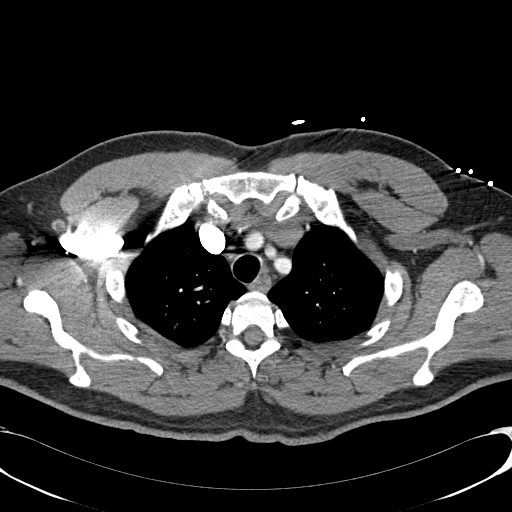
[im 117/129  lung]
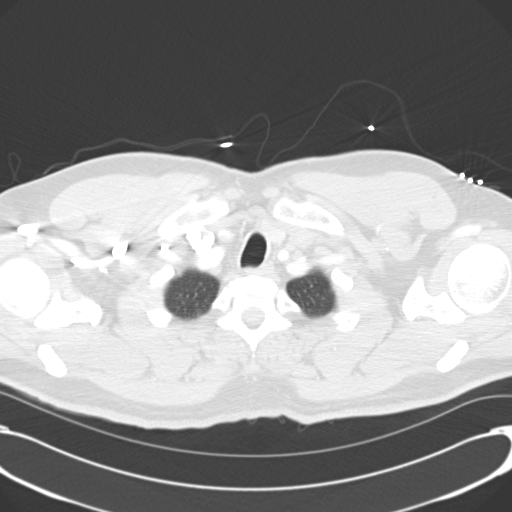
[im 123/129  soft-tissue]
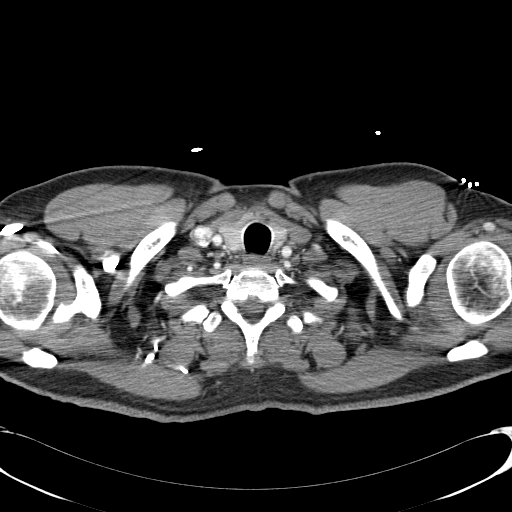

[Series 602: cor · coronal · 0.73mm/px · 3 of 126 slices shown]
[im 32/126  soft-tissue]
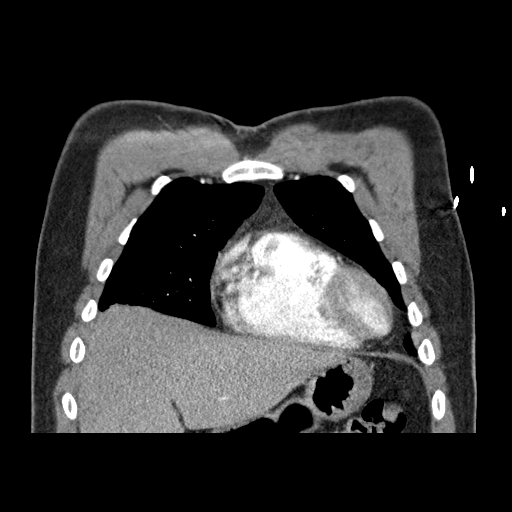
[im 63/126  soft-tissue]
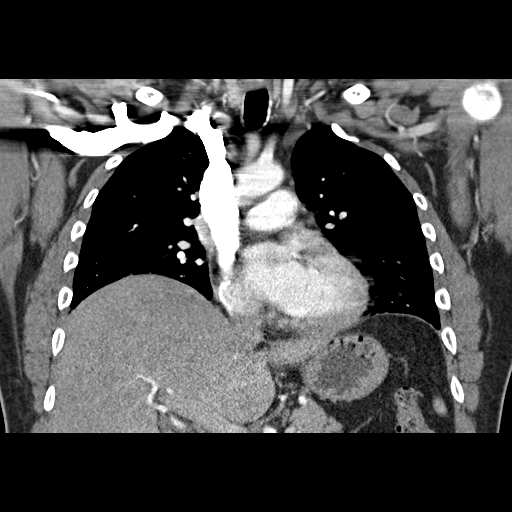
[im 94/126  soft-tissue]
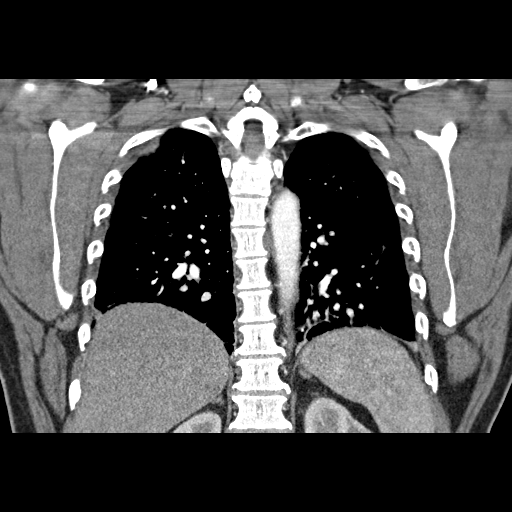

[19 of 46 positions shown; findings below may reference images not displayed]

FINDINGS: No filling defect identified within the pulmonary
arterial branches.  The aorta is of normal course and caliber.  The
heart size is upper normal limits.  No pericardial or pleural
effusions.  No intrathoracic lymphadenopathy identified.

Limited images through the upper abdomen show no acute abnormality.
The central airways are patent.  There are bibasilar airspace
opacities.  No pneumothorax.

No acute osseous abnormality.

Review of the MIP images confirms the above findings.
IMPRESSION: No pulmonary embolism or aortic dissection identified.

Bibasilar airspace opacities; atelectasis, aspiration, or
infiltrate.

## 2013-04-01 ENCOUNTER — Other Ambulatory Visit: Payer: Self-pay | Admitting: Pulmonary Disease

## 2013-04-29 ENCOUNTER — Other Ambulatory Visit: Payer: Self-pay | Admitting: Pulmonary Disease

## 2013-05-31 ENCOUNTER — Other Ambulatory Visit: Payer: Self-pay | Admitting: Pulmonary Disease

## 2013-07-02 ENCOUNTER — Other Ambulatory Visit: Payer: Self-pay | Admitting: Pulmonary Disease

## 2013-07-02 ENCOUNTER — Telehealth: Payer: Self-pay | Admitting: Pulmonary Disease

## 2013-07-02 MED ORDER — PANTOPRAZOLE SODIUM 40 MG PO TBEC: DELAYED_RELEASE_TABLET | ORAL | Status: DC

## 2013-07-02 MED ORDER — SIMVASTATIN 40 MG PO TABS: 40.0000 mg | ORAL_TABLET | Freq: Every day | ORAL | Status: DC

## 2013-07-02 NOTE — Telephone Encounter (Signed)
Spouse aware rx has been sent. Nothing further needed 

## 2013-07-29 ENCOUNTER — Other Ambulatory Visit: Payer: Self-pay | Admitting: Pulmonary Disease

## 2013-07-29 MED ORDER — HYDROCHLOROTHIAZIDE 25 MG PO TABS: ORAL_TABLET | ORAL | Status: DC

## 2013-07-29 MED ORDER — PANTOPRAZOLE SODIUM 40 MG PO TBEC: DELAYED_RELEASE_TABLET | ORAL | Status: DC

## 2013-07-29 MED ORDER — SIMVASTATIN 40 MG PO TABS: ORAL_TABLET | ORAL | Status: AC

## 2013-07-29 NOTE — Telephone Encounter (Signed)
Spoke with pts wife and she stated that SN had no openings until 10/23 and he has scheduled this appt with SN.  Refills of the hctz, simvastatin and protonix have been faxed to the pharmacy for #30 with no refills.

## 2013-08-11 ENCOUNTER — Ambulatory Visit: Payer: BC Managed Care – PPO | Admitting: Pulmonary Disease

## 2013-08-18 ENCOUNTER — Telehealth: Payer: Self-pay | Admitting: Pulmonary Disease

## 2013-08-18 MED ORDER — METHOCARBAMOL 500 MG PO TABS: 500.0000 mg | ORAL_TABLET | Freq: Three times a day (TID) | ORAL | Status: AC | PRN

## 2013-08-18 MED ORDER — TRAMADOL HCL 50 MG PO TABS: 50.0000 mg | ORAL_TABLET | Freq: Three times a day (TID) | ORAL | Status: AC | PRN

## 2013-08-18 NOTE — Telephone Encounter (Signed)
I spoke with pt. He reports he has a pinched nerve at the bottom of back of neck x 2 weeks. He has been using the heating pad to help with this. Has not taking antyhing OTC for the pain. He is requesting further recs. No openings tomorrow in [place] to see TP. Please advise SN thanks Last OV 10/04/12 with TP Pending 10/21/13  Allergies  Allergen Reactions  . Erythromycin     REACTION: rash/hives, swelling in mouth and throat

## 2013-08-18 NOTE — Telephone Encounter (Signed)
Per SN---  SN cannot schedule pt to see him without an MRI---we can call in tramadol 50 mg  1 po tid prn pain and methocarbamol 500 mg  1 po tid prn muscle spasms.  But he will need MRI  Cspine.   [facility].  Pt is aware of SN recs and stated that he has no insurance at this time and will check with the [facility] to see about the [facility].

## 2013-08-31 ENCOUNTER — Other Ambulatory Visit: Payer: Self-pay | Admitting: Pulmonary Disease

## 2013-10-21 ENCOUNTER — Telehealth: Payer: Self-pay | Admitting: Pulmonary Disease

## 2013-10-21 ENCOUNTER — Ambulatory Visit: Payer: BC Managed Care – PPO | Admitting: Pulmonary Disease

## 2013-10-21 NOTE — Telephone Encounter (Signed)
Spoke with the pt's spouse  She states that she is needing [place] to send list of meds that the pt takes and also reason for taking so that [facility] will [facility] and pay for  I have printed the last ov note which contains this information and mailed to her per her request  Nothing further needed per spouse

## 2013-10-21 NOTE — Telephone Encounter (Signed)
SN pt  LMTCB for Douglas Fletcher

## 2013-10-21 NOTE — Telephone Encounter (Signed)
Pt's spouse returned call.  Holly D Pryor ° °

## 2014-02-27 ENCOUNTER — Other Ambulatory Visit: Payer: Self-pay | Admitting: Pulmonary Disease

## 2017-01-18 ENCOUNTER — Encounter (HOSPITAL_COMMUNITY): Payer: Self-pay | Admitting: Emergency Medicine

## 2017-01-18 ENCOUNTER — Emergency Department (HOSPITAL_COMMUNITY)
Admission: EM | Admit: 2017-01-18 | Discharge: 2017-01-18 | Disposition: A | Discharge status: Home / Self Care | Payer: Non-veteran care | Attending: Emergency Medicine | Admitting: Emergency Medicine

## 2017-01-18 DIAGNOSIS — J020 Streptococcal pharyngitis: Secondary | ICD-10-CM | POA: Diagnosis not present

## 2017-01-18 DIAGNOSIS — J029 Acute pharyngitis, unspecified: Secondary | ICD-10-CM | POA: Diagnosis present

## 2017-01-18 DIAGNOSIS — Z87891 Personal history of nicotine dependence: Secondary | ICD-10-CM | POA: Insufficient documentation

## 2017-01-18 DIAGNOSIS — Z79899 Other long term (current) drug therapy: Secondary | ICD-10-CM | POA: Insufficient documentation

## 2017-01-18 DIAGNOSIS — I10 Essential (primary) hypertension: Secondary | ICD-10-CM | POA: Diagnosis not present

## 2017-01-18 HISTORY — DX: Dorsalgia, unspecified: M54.9

## 2017-01-18 HISTORY — DX: Essential (primary) hypertension: I10

## 2017-01-18 HISTORY — DX: Restless legs syndrome: G25.81

## 2017-01-18 HISTORY — DX: Pure hypercholesterolemia, unspecified: E78.00

## 2017-01-18 HISTORY — DX: Polyneuropathy, unspecified: G62.9

## 2017-01-18 MED ORDER — CLINDAMYCIN HCL 150 MG PO CAPS
300.0000 mg | ORAL_CAPSULE | Freq: Once | ORAL | Status: AC
  Administered 2017-01-18: 300 mg via ORAL
  Filled 2017-01-18: qty 2

## 2017-01-18 MED ORDER — DEXAMETHASONE 4 MG PO TABS
10.0000 mg | ORAL_TABLET | Freq: Once | ORAL | Status: AC
  Administered 2017-01-18: 10 mg via ORAL
  Filled 2017-01-18: qty 3

## 2017-01-18 MED ORDER — CLINDAMYCIN HCL 300 MG PO CAPS: 300.0000 mg | ORAL_CAPSULE | Freq: Four times a day (QID) | ORAL | 0 refills | Status: AC

## 2017-01-18 NOTE — ED Provider Notes (Signed)
[facility]-EMERGENCY DEPT Provider Note   CSN: [id number] Arrival date & time: 01/18/17  0815  By signing my name below, I, Marnette Burgess Long, attest that this documentation has been prepared under the direction and in the presence of Marily Memos, MD. Electronically Signed: Marnette Burgess Long, Scribe. 01/18/2017. 8:35 AM.  History   Chief Complaint Chief Complaint  Patient presents with  . Sore Throat   The history is provided by the patient and medical records. No language interpreter was used.    HPI Comments:  Douglas Fletcher is a 51 y.o. male who presents to the Emergency Department complaining of sore throat onset two days ago. He reports he called his PCP but they said the Doctor would not be in and thus referred him to [facility]. Pt has associated symptoms of fever (TMax 103) and cough with whitish sputum. Of note, he states he served in the [employer] for 10 years and was medically discharged for his chronic neck and back pain. No alleviating factors noted. His wife states sick contact with similar symptoms in their granddaughter who had strep throat and a recent stressful event with their father passing two weeks ago. Pt denies trouble swallowing and reduced PO intake but states his throat is quite painful. Pt is a former smoker quitting in 2006.     Past Medical History:  Diagnosis Date  . Anxiety   . Back pain   . GERD (gastroesophageal reflux disease)   . Hiatal hernia   . High cholesterol   . Hypertension   . Neuropathy (HCC)   . Osteoarthritis   . Restless leg     Patient Active Problem List   Diagnosis Date Noted  . Tonsillitis 10/04/2012  . Fatigue 04/23/2012  . HBP (high blood pressure) 04/23/2012  . Overweight(278.02) 04/23/2012  . Tick bite 02/28/2011  . Hypercholesteremia 02/28/2011  . URINARY URGENCY 05/27/2010  . CERVICAL MUSCLE STRAIN 07/02/2009  . OTITIS MEDIA, RIGHT 07/20/2008  . ANXIETY 04/02/2008  . GERD 04/02/2008  . OSTEOARTHRITIS 04/02/2008  . SHOULDER PAIN,  RIGHT 04/02/2008    Past Surgical History:  Procedure Laterality Date  . APPENDECTOMY  1980's  . wisdom teeth surgery      Home Medications    Prior to Admission medications   Medication Sig Start Date End Date Taking? Authorizing Provider  cefdinir (OMNICEF) 300 MG capsule Take 1 capsule (300 mg total) by mouth 2 (two) times daily. 10/04/12   Tammy S Parrett, NP  cetirizine (ZYRTEC) 10 MG tablet Take 10 mg by mouth at bedtime.      Historical Provider, MD  clindamycin (CLEOCIN) 300 MG capsule Take 1 capsule (300 mg total) by mouth 4 (four) times daily. X 7 days 01/18/17   Marily Memos, MD  Diphenhyd-Hydrocort-Nystatin (FIRST-DUKES MOUTHWASH) SUSP Gargle and swallow 1 tsp four times daily 10/09/12   Michele Mcalpine, MD  gabapentin (NEURONTIN) 100 MG capsule take 1 capsule three times a day 01/31/13   Julio Sicks, NP  hydrochlorothiazide (HYDRODIURIL) 25 MG tablet take 1 tablet by mouth once daily 08/31/13   Michele Mcalpine, MD  methocarbamol (ROBAXIN) 500 MG tablet Take 1 tablet (500 mg total) by mouth 3 (three) times daily as needed (muscle spasms). 08/18/13   Michele Mcalpine, MD  methylPREDNISolone (MEDROL DOSEPAK) 4 MG tablet 1 tab by mouth twice daily x4 days, 1 daily x4 days, 1/2 daily x4 days, 1/2 every other day until gone. 10/09/12   Michele Mcalpine, MD  Misc Natural  Products (OSTEO BI-FLEX ADV DOUBLE ST PO) Take 1 tablet by mouth daily.      Historical Provider, MD  moxifloxacin (AVELOX) 400 MG tablet Take 1 tablet (400 mg total) by mouth daily. 10/09/12   Michele McalpineScott M Nadel, MD  Multiple Vitamins-Minerals (PX MENS MULTIVITAMINS PO) Take 1 tablet by mouth daily.      Historical Provider, MD  neomycin-polymyxin-gramicidin (NEOSPORIN) ophthalmic solution 1-2 drops in affected eye every 6 hours 05/08/12   Michele McalpineScott M Nadel, MD  pantoprazole (PROTONIX) 40 MG tablet take 1 tablet by mouth twice a day 08/31/13   Michele McalpineScott M Nadel, MD  predniSONE (DELTASONE) 10 MG tablet 4 tabs for 2 days, then 3 tabs for 2  days, 2 tabs for 2 days, then 1 tab for 2 days, then stop 10/04/12   Tammy S Parrett, NP  simvastatin (ZOCOR) 40 MG tablet take 1 tablet by mouth at bedtime (NEEDS OFFICE VISIT) 07/29/13   Michele McalpineScott M Nadel, MD  traMADol (ULTRAM) 50 MG tablet Take 1 tablet (50 mg total) by mouth 3 (three) times daily as needed for pain. 08/18/13   Michele McalpineScott M Nadel, MD    Family History History reviewed. No pertinent family history.  Social History Social History  Substance Use Topics  . Smoking status: Former Smoker    Packs/day: 1.00    Years: 3.00    Types: Cigarettes    Quit date: 10/30/2004  . Smokeless tobacco: Never Used  . Alcohol use Yes     Comment: yes social use     Allergies   Erythromycin   Review of Systems Review of Systems  Constitutional: Positive for fever. Negative for appetite change.  HENT: Positive for sore throat. Negative for trouble swallowing.   Respiratory: Positive for cough.   All other systems reviewed and are negative.    Physical Exam Updated Vital Signs BP (!) 128/91 (BP Location: Left Arm)   Pulse 86   Temp 100.3 F (37.9 C) (Oral)   Resp 18   Ht 5\' 10"  (1.778 m)   Wt 240 lb (108.9 kg)   SpO2 95%   BMI 34.44 kg/m   Physical Exam  Constitutional: He is oriented to person, place, and time. He appears well-developed and well-nourished.  HENT:  Head: Normocephalic.  Tonsillar exudates. Erythema in the back of his oropharynx. Left tonsil is slightly more prominent than the right. Uvula midline.   Eyes: Conjunctivae are normal.  Neck:  Left sided and right sided cervical adenopathy.  Cardiovascular: Normal rate, regular rhythm and normal heart sounds.   Pulmonary/Chest: Effort normal and breath sounds normal. No respiratory distress. He has no wheezes. He has no rales.  Abdominal: He exhibits no distension.  Musculoskeletal: Normal range of motion.  Lymphadenopathy:    He has cervical adenopathy.  Neurological: He is alert and oriented to person, place,  and time.  Skin: Skin is warm and dry.  Psychiatric: He has a normal mood and affect.  Nursing note and vitals reviewed.   ED Treatments / Results  DIAGNOSTIC STUDIES:  Oxygen Saturation is 95% on RA, adequate by my interpretation.    COORDINATION OF CARE:  8:30 AM Discussed treatment plan with pt at bedside including: I/D or aspiration for abscess here (patient denied), follow up to ENT for potential I&D if not improving with Abx and steroids here in the ED and pt agreed to plan.  Labs (all labs ordered are listed, but only abnormal results are displayed) Labs Reviewed - No data to display  EKG  EKG Interpretation None       Radiology No results found.  Procedures Procedures (including critical care time)  Medications Ordered in ED Medications  dexamethasone (DECADRON) tablet 10 mg (10 mg Oral Given 01/18/17 0841)  clindamycin (CLEOCIN) capsule 300 mg (300 mg Oral Given 01/18/17 0841)     Initial Impression / Assessment and Plan / ED Course  I have reviewed the triage vital signs and the nursing notes.  Pertinent labs & imaging results that were available during my care of the patient were reviewed by me and considered in my medical decision making (see chart for details).     5/5 centor, likely strep pharyngitis. L>R swelling, possibly early PTA, patient refused aspiration here but will come back if worsening, otherwise will follow up with ENT in 2-3 days if not improving. abx/steroids here and abx at home.   Final Clinical Impressions(s) / ED Diagnoses   Final diagnoses:  Strep throat    New Prescriptions Discharge Medication List as of 01/18/2017  8:34 AM    START taking these medications   Details  clindamycin (CLEOCIN) 300 MG capsule Take 1 capsule (300 mg total) by mouth 4 (four) times daily. X 7 days, Starting Thu 01/18/2017, Print       I personally performed the services described in this documentation, which was scribed in my presence. The  recorded information has been reviewed and is accurate.     Marily Memos, MD 01/18/17 [phone]

## 2017-01-18 NOTE — ED Triage Notes (Signed)
PT c/o fever, sore throat, body aches/chills x3 days.

## 2017-02-02 ENCOUNTER — Emergency Department (HOSPITAL_COMMUNITY)
Admission: EM | Admit: 2017-02-02 | Discharge: 2017-02-02 | Disposition: A | Discharge status: Home / Self Care | Payer: Non-veteran care | Attending: Emergency Medicine | Admitting: Emergency Medicine

## 2017-02-02 ENCOUNTER — Encounter (HOSPITAL_COMMUNITY): Payer: Self-pay

## 2017-02-02 DIAGNOSIS — Z79899 Other long term (current) drug therapy: Secondary | ICD-10-CM | POA: Insufficient documentation

## 2017-02-02 DIAGNOSIS — M5442 Lumbago with sciatica, left side: Secondary | ICD-10-CM | POA: Diagnosis not present

## 2017-02-02 DIAGNOSIS — I10 Essential (primary) hypertension: Secondary | ICD-10-CM | POA: Insufficient documentation

## 2017-02-02 DIAGNOSIS — Z87891 Personal history of nicotine dependence: Secondary | ICD-10-CM | POA: Diagnosis not present

## 2017-02-02 DIAGNOSIS — M545 Low back pain: Secondary | ICD-10-CM | POA: Diagnosis present

## 2017-02-02 MED ORDER — HYDROCODONE-ACETAMINOPHEN 5-325 MG PO TABS: 1.0000 | ORAL_TABLET | ORAL | 0 refills | Status: DC | PRN

## 2017-02-02 MED ORDER — HYDROMORPHONE HCL 1 MG/ML IJ SOLN
1.0000 mg | Freq: Once | INTRAMUSCULAR | Status: AC
  Administered 2017-02-02: 1 mg via INTRAMUSCULAR
  Filled 2017-02-02: qty 1

## 2017-02-02 MED ORDER — ONDANSETRON 8 MG PO TBDP
8.0000 mg | ORAL_TABLET | Freq: Once | ORAL | Status: AC
  Administered 2017-02-02: 8 mg via ORAL
  Filled 2017-02-02: qty 1

## 2017-02-02 MED ORDER — PREDNISONE 10 MG PO TABS: ORAL_TABLET | ORAL | 0 refills | Status: AC

## 2017-02-02 NOTE — Discharge Instructions (Signed)
Do not drive within 4 hours of taking hydrocodone as this will make you drowsy.  Avoid lifting,  Bending,  Twisting or any other activity that worsens your pain over the next week.  Apply an  icepack  to your lower back for 10-15 minutes every 2 hours for the next 2 days, after which you may also add a heating pad applied for 20 minutes several times daily.  You should get rechecked if your symptoms are not better over the next 5 days,  Or you develop increased pain,  Weakness in your leg(s) or loss of bladder or bowel function - these are symptoms of a worsening condition.

## 2017-02-02 NOTE — ED Triage Notes (Signed)
Pt reports he bent over yesterday and his back went out. Pain is in lower back and radiates down left leg. No numbness or tingling

## 2017-02-02 NOTE — ED Notes (Signed)
[facility] [place] [facility]- last pm bent over and  Now with back pain unrelieved by his usual meds

## 2017-02-02 NOTE — ED Notes (Signed)
Up for DC awaiting paperwork

## 2017-02-02 NOTE — ED Provider Notes (Signed)
[facility]-EMERGENCY DEPT Provider Note   CSN: [id number] Arrival date & time: 02/02/17  1731     History   Chief Complaint Chief Complaint  Patient presents with  . Back Pain    HPI Douglas Fletcher is a 51 y.o. male presenting with acute on chronic low back pain which has which has been present for the past 24 hours since bending down to lift a cooler lid.  He has known Degenerative disc disease in his C-spine in his lumbar spine and is currently awaiting referral from his PCP at the [place] to a neurosurgeon at [facility] for evaluation of possible surgical intervention.   Patient denies any new injury specifically.  There is radiation of pain into the left  Upper thigh.  There has been no weakness or numbness in the lower extremities and no urinary or bowel retention or incontinence.  Patient does not have a history of cancer or IVDU.  The patient takes Flexeril and gabapentin for chronic pain relief which is not working with this new worsening pain.  The history is provided by the patient.    Past Medical History:  Diagnosis Date  . Anxiety   . Back pain   . GERD (gastroesophageal reflux disease)   . Hiatal hernia   . High cholesterol   . Hypertension   . Neuropathy (HCC)   . Osteoarthritis   . Restless leg     Patient Active Problem List   Diagnosis Date Noted  . Tonsillitis 10/04/2012  . Fatigue 04/23/2012  . HBP (high blood pressure) 04/23/2012  . Overweight(278.02) 04/23/2012  . Tick bite 02/28/2011  . Hypercholesteremia 02/28/2011  . URINARY URGENCY 05/27/2010  . CERVICAL MUSCLE STRAIN 07/02/2009  . OTITIS MEDIA, RIGHT 07/20/2008  . ANXIETY 04/02/2008  . GERD 04/02/2008  . OSTEOARTHRITIS 04/02/2008  . SHOULDER PAIN, RIGHT 04/02/2008    Past Surgical History:  Procedure Laterality Date  . APPENDECTOMY  1980's  . wisdom teeth surgery         Home Medications    Prior to Admission medications   Medication Sig Start Date End Date Taking? Authorizing Provider    cefdinir (OMNICEF) 300 MG capsule Take 1 capsule (300 mg total) by mouth 2 (two) times daily. 10/04/12   Tammy S Parrett, NP  cetirizine (ZYRTEC) 10 MG tablet Take 10 mg by mouth at bedtime.      Historical Provider, MD  clindamycin (CLEOCIN) 300 MG capsule Take 1 capsule (300 mg total) by mouth 4 (four) times daily. X 7 days 01/18/17   Marily Memos, MD  Diphenhyd-Hydrocort-Nystatin (FIRST-DUKES MOUTHWASH) SUSP Gargle and swallow 1 tsp four times daily 10/09/12   Michele Mcalpine, MD  gabapentin (NEURONTIN) 100 MG capsule take 1 capsule three times a day 01/31/13   Julio Sicks, NP  hydrochlorothiazide (HYDRODIURIL) 25 MG tablet take 1 tablet by mouth once daily 08/31/13   Michele Mcalpine, MD  HYDROcodone-acetaminophen (NORCO/VICODIN) 5-325 MG tablet Take 1 tablet by mouth every 4 (four) hours as needed. 02/02/17   Burgess Amor, PA-C  methocarbamol (ROBAXIN) 500 MG tablet Take 1 tablet (500 mg total) by mouth 3 (three) times daily as needed (muscle spasms). 08/18/13   Michele Mcalpine, MD  methylPREDNISolone (MEDROL DOSEPAK) 4 MG tablet 1 tab by mouth twice daily x4 days, 1 daily x4 days, 1/2 daily x4 days, 1/2 every other day until gone. 10/09/12   Michele Mcalpine, MD  Misc Natural Products (OSTEO BI-FLEX ADV DOUBLE ST PO) Take 1  tablet by mouth daily.      Historical Provider, MD  moxifloxacin (AVELOX) 400 MG tablet Take 1 tablet (400 mg total) by mouth daily. 10/09/12   Michele Mcalpine, MD  Multiple Vitamins-Minerals (PX MENS MULTIVITAMINS PO) Take 1 tablet by mouth daily.      Historical Provider, MD  neomycin-polymyxin-gramicidin (NEOSPORIN) ophthalmic solution 1-2 drops in affected eye every 6 hours 05/08/12   Michele Mcalpine, MD  pantoprazole (PROTONIX) 40 MG tablet take 1 tablet by mouth twice a day 08/31/13   Michele Mcalpine, MD  predniSONE (DELTASONE) 10 MG tablet Take 6 tablets day one, 5 tablets day two, 4 tablets day three, 3 tablets day four, 2 tablets day five, then 1 tablet day six 02/02/17   Burgess Amor, PA-C   simvastatin (ZOCOR) 40 MG tablet take 1 tablet by mouth at bedtime (NEEDS OFFICE VISIT) 07/29/13   Michele Mcalpine, MD  traMADol (ULTRAM) 50 MG tablet Take 1 tablet (50 mg total) by mouth 3 (three) times daily as needed for pain. 08/18/13   Michele Mcalpine, MD    Family History No family history on file.  Social History Social History  Substance Use Topics  . Smoking status: Former Smoker    Packs/day: 1.00    Years: 3.00    Types: Cigarettes    Quit date: 10/30/2004  . Smokeless tobacco: Never Used  . Alcohol use No     Comment: yes social use     Allergies   Erythromycin   Review of Systems Review of Systems  Constitutional: Negative for fever.  Respiratory: Negative for shortness of breath.   Cardiovascular: Negative for chest pain and leg swelling.  Gastrointestinal: Negative for abdominal distention, abdominal pain and constipation.  Genitourinary: Negative for difficulty urinating, dysuria, flank pain, frequency and urgency.  Musculoskeletal: Positive for back pain. Negative for gait problem and joint swelling.  Skin: Negative for rash.  Neurological: Negative for weakness and numbness.     Physical Exam Updated Vital Signs BP 132/80 (BP Location: Right Arm)   Pulse 77   Temp 99 F (37.2 C) (Oral)   Resp 16   Ht  (1.778 m)   Wt 108.9 kg   SpO2 100%   BMI 34.44 kg/m   Physical Exam  Constitutional: He appears well-developed and well-nourished.  HENT:  Head: Normocephalic.  Eyes: Conjunctivae are normal.  Neck: Normal range of motion. Neck supple.  Cardiovascular: Normal rate and intact distal pulses.   Pedal pulses normal.  Pulmonary/Chest: Effort normal.  Abdominal: Soft. Bowel sounds are normal. He exhibits no distension and no mass.  Musculoskeletal: Normal range of motion. He exhibits no edema.       Lumbar back: He exhibits tenderness. He exhibits no swelling, no edema and no spasm.  Neurological: He is alert. He has normal strength. He  displays no atrophy and no tremor. No sensory deficit. Gait normal. He displays no Babinski's sign on the right side. He displays no Babinski's sign on the left side.  Reflex Scores:      Patellar reflexes are 2+ on the right side and 2+ on the left side. No strength deficit noted in hip and knee flexor and extensor muscle groups.  Ankle flexion and extension intact.  Positive straight leg raise right.  Skin: Skin is warm and dry.  Psychiatric: He has a normal mood and affect.  Nursing note and vitals reviewed.    ED Treatments / Results  Labs (all labs ordered  are listed, but only abnormal results are displayed) Labs Reviewed - No data to display  EKG  EKG Interpretation None       Radiology No results found.  Procedures Procedures (including critical care time)  Medications Ordered in ED Medications  HYDROmorphone (DILAUDID) injection 1 mg (1 mg Intramuscular Given 02/02/17 2017)  ondansetron (ZOFRAN-ODT) disintegrating tablet 8 mg (8 mg Oral Given 02/02/17 2016)     Initial Impression / Assessment and Plan / ED Course  I have reviewed the triage vital signs and the nursing notes.  Pertinent labs & imaging results that were available during my care of the patient were reviewed by me and considered in my medical decision making (see chart for details).     Patient was given Dilaudid IM 1 mg. Discussed activity as tolerated at home, heat therapy, continue with his Flexeril, will place him on a prednisone taper. Planned follow-up with his PCP and/or the neurosurgeon that he is awaiting to see.  No neuro deficit on exam or by history to suggest emergent or surgical presentation.  Also discussed worsened sx that should prompt immediate re-evaluation including distal weakness, bowel/bladder retention/incontinence.     [place] controlled substance database reviewed.   Final Clinical Impressions(s) / ED Diagnoses   Final diagnoses:  Acute left-sided low back pain with  left-sided sciatica    New Prescriptions New Prescriptions   HYDROCODONE-ACETAMINOPHEN (NORCO/VICODIN) 5-325 MG TABLET    Take 1 tablet by mouth every 4 (four) hours as needed.   PREDNISONE (DELTASONE) 10 MG TABLET    Take 6 tablets day one, 5 tablets day two, 4 tablets day three, 3 tablets day four, 2 tablets day five, then 1 tablet day six     Burgess Amor, PA-C 02/02/17 2042    Marily Memos, MD 02/02/17 2145

## 2019-12-05 ENCOUNTER — Ambulatory Visit: Payer: Non-veteran care | Attending: Internal Medicine

## 2019-12-05 ENCOUNTER — Other Ambulatory Visit: Payer: Self-pay

## 2019-12-05 DIAGNOSIS — Z20822 Contact with and (suspected) exposure to covid-19: Secondary | ICD-10-CM

## 2019-12-07 ENCOUNTER — Telehealth: Payer: Self-pay | Admitting: Physician Assistant

## 2019-12-07 LAB — NOVEL CORONAVIRUS, NAA: SARS-CoV-2, NAA: DETECTED — AB

## 2019-12-07 NOTE — Telephone Encounter (Signed)
Called to discuss with Douglas Fletcher about Covid symptoms and the use of bamlanivimab or casirivimab/imdevimab, a monoclonal antibody infusion for those with mild to moderate Covid symptoms and at a high risk of hospitalization.     Pt is qualified for this infusion at the [facility] infusion center due to co-morbid conditions and/or a member of an at-risk group, however declines infusion at this time. He would like to think and read more about it.  Symptoms tier reviewed as well as criteria for ending isolation.  Symptoms reviewed that would warrant ED/Hospital evaluation. Preventative practices reviewed. Patient verbalized understanding. Patient advised to call back if he decides that he does want to get infusion. Callback number to the infusion center given. Patient advised to go to Urgent care or ED with severe symptoms. Last date pt would be eligible for infusion is 12/10/19.    Patient Active Problem List   Diagnosis Date Noted  . Tonsillitis 10/04/2012  . Fatigue 04/23/2012  . HBP (high blood pressure) 04/23/2012  . Overweight(278.02) 04/23/2012  . Tick bite 02/28/2011  . Hypercholesteremia 02/28/2011  . URINARY URGENCY 05/27/2010  . CERVICAL MUSCLE STRAIN 07/02/2009  . OTITIS MEDIA, RIGHT 07/20/2008  . ANXIETY 04/02/2008  . GERD 04/02/2008  . OSTEOARTHRITIS 04/02/2008  . SHOULDER PAIN, RIGHT 04/02/2008    Cline Crock PA-C

## 2021-03-23 ENCOUNTER — Emergency Department (HOSPITAL_COMMUNITY)
Admission: EM | Admit: 2021-03-23 | Discharge: 2021-03-23 | Disposition: A | Discharge status: Home / Self Care | Payer: No Typology Code available for payment source | Attending: Emergency Medicine | Admitting: Emergency Medicine

## 2021-03-23 ENCOUNTER — Other Ambulatory Visit: Payer: Self-pay

## 2021-03-23 ENCOUNTER — Encounter (HOSPITAL_COMMUNITY): Payer: Self-pay

## 2021-03-23 DIAGNOSIS — M545 Low back pain, unspecified: Secondary | ICD-10-CM | POA: Diagnosis not present

## 2021-03-23 DIAGNOSIS — I10 Essential (primary) hypertension: Secondary | ICD-10-CM | POA: Insufficient documentation

## 2021-03-23 DIAGNOSIS — Z79899 Other long term (current) drug therapy: Secondary | ICD-10-CM | POA: Insufficient documentation

## 2021-03-23 DIAGNOSIS — Z87891 Personal history of nicotine dependence: Secondary | ICD-10-CM | POA: Insufficient documentation

## 2021-03-23 MED ORDER — NAPROXEN 500 MG PO TABS: 500.0000 mg | ORAL_TABLET | Freq: Two times a day (BID) | ORAL | 0 refills | Status: AC | PRN

## 2021-03-23 MED ORDER — HYDROMORPHONE HCL 1 MG/ML IJ SOLN
1.0000 mg | Freq: Once | INTRAMUSCULAR | Status: AC
  Administered 2021-03-23: 1 mg via INTRAMUSCULAR
  Filled 2021-03-23: qty 1

## 2021-03-23 MED ORDER — LIDOCAINE 5 % EX PTCH: 1.0000 | MEDICATED_PATCH | CUTANEOUS | 0 refills | Status: AC

## 2021-03-23 MED ORDER — KETOROLAC TROMETHAMINE 60 MG/2ML IM SOLN
60.0000 mg | Freq: Once | INTRAMUSCULAR | Status: AC
  Administered 2021-03-23: 60 mg via INTRAMUSCULAR
  Filled 2021-03-23: qty 2

## 2021-03-23 MED ORDER — HYDROCODONE-ACETAMINOPHEN 5-325 MG PO TABS: 1.0000 | ORAL_TABLET | Freq: Four times a day (QID) | ORAL | 0 refills | Status: AC | PRN

## 2021-03-23 MED ORDER — LIDOCAINE 5 % EX PTCH
1.0000 | MEDICATED_PATCH | CUTANEOUS | Status: DC
  Administered 2021-03-23: 1 via TRANSDERMAL
  Filled 2021-03-23: qty 1

## 2021-03-23 NOTE — ED Provider Notes (Signed)
[facility] EMERGENCY DEPARTMENT Provider Note   CSN: [id number] Arrival date & time: 03/23/21  1107     History Chief Complaint  Patient presents with  . Back Pain    Douglas Fletcher is a 55 y.o. male with past medical history significant for HTN, HLD, and chronic low back pain followed by the [place] who presents the ED with complaints of severe low back pain.  On my examination, patient is very uncomfortable in bed.  He states that on 02/28/2021 he tweaked his back and the [place] advised him to stay out of work for 10 to 14 days and scheduled him for an MRI April 04, 2021.  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  Since then it has been getting progressively worse.  Today he has been having difficulty ambulating due to the pain.  He states that he also passed out on his way to the bathroom due to his pain symptoms.  Last BM was yesterday, soft brown.  He was able to urinate this morning.  Denies any urinary retention or incontinence.  He also denies any fevers, chills, history of IVDA or other illicit drug use, abdominal pain, inability to ambulate, or any other symptoms.   HPI     Past Medical History:  Diagnosis Date  . Anxiety   . Back pain   . GERD (gastroesophageal reflux disease)   . Hiatal hernia   . High cholesterol   . Hypertension   . Neuropathy   . Osteoarthritis   . Restless leg     Patient Active Problem List   Diagnosis Date Noted  . Tonsillitis 10/04/2012  . Fatigue 04/23/2012  . HBP (high blood pressure) 04/23/2012  . Overweight(278.02) 04/23/2012  . Tick bite 02/28/2011  . Hypercholesteremia 02/28/2011  . URINARY URGENCY 05/27/2010  . CERVICAL MUSCLE STRAIN 07/02/2009  . OTITIS MEDIA, RIGHT 07/20/2008  . ANXIETY 04/02/2008  . GERD 04/02/2008  . OSTEOARTHRITIS 04/02/2008  . SHOULDER PAIN, RIGHT 04/02/2008    Past Surgical History:  Procedure Laterality Date  . APPENDECTOMY  1980's  . wisdom teeth  surgery         No family history on file.  Social History   Tobacco Use  . Smoking status: Former Smoker    Packs/day: 1.00    Years: 3.00    Pack years: 3.00    Types: Cigarettes    Quit date: 10/30/2004    Years since quitting: 16.4  . Smokeless tobacco: Never Used  Substance Use Topics  . Alcohol use: No    Comment: yes social use  . Drug use: No    Comment: smokes marijuana once daily    Home Medications Prior to Admission medications   Medication Sig Start Date End Date Taking? Authorizing Provider  HYDROcodone-acetaminophen (NORCO/VICODIN) 5-325 MG tablet Take 1 tablet by mouth every 6 (six) hours as needed for up to 10 doses for severe pain. 03/23/21  Yes Lorelee New, PA-C  lidocaine (LIDODERM) 5 % Place 1 patch onto the skin daily. Remove & Discard patch within 12 hours or as directed by MD 03/23/21  Yes Lorelee New, PA-C  naproxen (NAPROSYN) 500 MG tablet Take 1 tablet (500 mg total) by mouth 2 (two) times daily as needed for moderate pain. 03/23/21  Yes Lorelee New, PA-C  cefdinir (OMNICEF) 300 MG capsule Take 1 capsule (300 mg total) by mouth 2 (two) times daily. 10/04/12  Parrett, Tammy S, NP  cetirizine (ZYRTEC) 10 MG tablet Take 10 mg by mouth at bedtime.      [provider]  clindamycin (CLEOCIN) 300 MG capsule Take 1 capsule (300 mg total) by mouth 4 (four) times daily. X 7 days 01/18/17   Mesner, Barbara Cower, MD  Diphenhyd-Hydrocort-Nystatin (FIRST-DUKES MOUTHWASH) SUSP Gargle and swallow 1 tsp four times daily 10/09/12   Michele Mcalpine, MD  gabapentin (NEURONTIN) 100 MG capsule take 1 capsule three times a day 01/31/13   Parrett, Virgel Bouquet, NP  hydrochlorothiazide (HYDRODIURIL) 25 MG tablet take 1 tablet by mouth once daily 08/31/13   Michele Mcalpine, MD  methocarbamol (ROBAXIN) 500 MG tablet Take 1 tablet (500 mg total) by mouth 3 (three) times daily as needed (muscle spasms). 08/18/13   Michele Mcalpine, MD  methylPREDNISolone (MEDROL DOSEPAK) 4 MG  tablet 1 tab by mouth twice daily x4 days, 1 daily x4 days, 1/2 daily x4 days, 1/2 every other day until gone. 10/09/12   Michele Mcalpine, MD  Misc Natural Products (OSTEO BI-FLEX ADV DOUBLE ST PO) Take 1 tablet by mouth daily.      [provider]  moxifloxacin (AVELOX) 400 MG tablet Take 1 tablet (400 mg total) by mouth daily. 10/09/12   Michele Mcalpine, MD  Multiple Vitamins-Minerals (PX MENS MULTIVITAMINS PO) Take 1 tablet by mouth daily.      [provider]  neomycin-polymyxin-gramicidin (NEOSPORIN) ophthalmic solution 1-2 drops in affected eye every 6 hours 05/08/12   Michele Mcalpine, MD  pantoprazole (PROTONIX) 40 MG tablet take 1 tablet by mouth twice a day 08/31/13   Michele Mcalpine, MD  predniSONE (DELTASONE) 10 MG tablet Take 6 tablets day one, 5 tablets day two, 4 tablets day three, 3 tablets day four, 2 tablets day five, then 1 tablet day six 02/02/17   Idol, Raynelle Fanning, PA-C  simvastatin (ZOCOR) 40 MG tablet take 1 tablet by mouth at bedtime (NEEDS OFFICE VISIT) 07/29/13   Michele Mcalpine, MD  traMADol (ULTRAM) 50 MG tablet Take 1 tablet (50 mg total) by mouth 3 (three) times daily as needed for pain. 08/18/13   Michele Mcalpine, MD    Allergies    Erythromycin  Review of Systems   Review of Systems  All other systems reviewed and are negative.   Physical Exam Updated Vital Signs BP 93/61   Pulse 64   Temp 98.5 F (36.9 C) (Oral)   Resp 14   Ht 5\' 10"  (1.778 m)   Wt 83.9 kg   SpO2 92%   BMI 26.54 kg/m   Physical Exam Vitals and nursing note reviewed. Exam conducted with a chaperone present.  Constitutional:      General: He is not in acute distress.    Appearance: He is not toxic-appearing.     Comments: In significant discomfort.  HENT:     Head: Normocephalic and atraumatic.  Eyes:     General: No scleral icterus.    Conjunctiva/sclera: Conjunctivae normal.  Cardiovascular:     Rate and Rhythm: Normal rate.     Pulses: Normal pulses.  Pulmonary:      Effort: Pulmonary effort is normal. No respiratory distress.  Musculoskeletal:        General: Tenderness present. Normal range of motion.     Cervical back: Normal range of motion. No rigidity.     Comments: Tenderness over lumbar spine.  No appreciable step-off.  No overlying skin changes.  No mass.  No fluctuance.  No TTP elsewhere.  Can move lower legs.  Peripheral pulses and sensation intact.  Skin:    General: Skin is dry.  Neurological:     General: No focal deficit present.     Mental Status: He is alert and oriented to person, place, and time.     GCS: GCS eye subscore is 4. GCS verbal subscore is 5. GCS motor subscore is 6.     Comments: Antalgic gait, but otherwise neurovascularly intact.  Psychiatric:        Mood and Affect: Mood normal.        Behavior: Behavior normal.        Thought Content: Thought content normal.     ED Results / Procedures / Treatments   Labs (all labs ordered are listed, but only abnormal results are displayed) Labs Reviewed - No data to display  EKG None  Radiology No results found.  Procedures Procedures   Medications Ordered in ED Medications  lidocaine (LIDODERM) 5 % 1 patch (1 patch Transdermal Patch Applied 03/23/21 1235)  HYDROmorphone (DILAUDID) injection 1 mg (1 mg Intramuscular Given 03/23/21 1233)  ketorolac (TORADOL) injection 60 mg (60 mg Intramuscular Given 03/23/21 1234)    ED Course  I have reviewed the triage vital signs and the nursing notes.  Pertinent labs & imaging results that were available during my care of the patient were reviewed by me and considered in my medical decision making (see chart for details).    MDM Rules/Calculators/A&P                          DAYVEN LINSLEY was evaluated in Emergency Department on 03/23/2021 for the symptoms described in the history of present illness. He was evaluated in the context of the global COVID-19 pandemic, which necessitated consideration that the patient might be at  risk for infection with the SARS-CoV-2 virus that causes COVID-19. Institutional protocols and algorithms that pertain to the evaluation of patients at risk for COVID-19 are in a state of rapid change based on information released by regulatory bodies including the CDC and federal and state organizations. These policies and algorithms were followed during the patient's care in the ED.  I personally reviewed patient's medical chart and all notes from triage and staff during today's encounter. I have also ordered and reviewed all labs and imaging that I felt to be medically necessary in the evaluation of this patient's complaints and with consideration of their physical exam. If needed, translation services were available and utilized.   Patient with acute on chronic low back pain.  No evidence to suggest cord compression or cauda equina.  While they inquired about MRI here in the ED, not emergent.  He is ambulatory.  He was denies any pain, but treated with Toradol, Dilaudid, and Lidoderm patch.  On subsequent evaluation, his pain is improved from 10 out of 10 to 2 out of 10.  He feels much improved and is ready for discharge.  [facility] understands that he will need to have his MRI obtained on outpatient basis unless he develops any neurologic deficits.  He is afebrile and denies any history of IVDA.  Low suspicion for epidural abscess.  No red flags concerning patient's back pain. No s/s of central cord compression or cauda equina. Lower extremities are neurovascularly intact and patient is ambulating without difficulty.  Patient appreciative of today's work-up.  X-rays not warranted  given lack of any new trauma concerning for fracture.  Suspect this is a disc herniation.  ER return precautions discussed.  Patient and wife at bedside voiced understanding and are agreeable to the plan.  Final Clinical Impression(s) / ED Diagnoses Final diagnoses:  Acute midline low back pain,  unspecified whether sciatica present    Rx / DC Orders ED Discharge Orders         Ordered    HYDROcodone-acetaminophen (NORCO/VICODIN) 5-325 MG tablet  Every 6 hours PRN        03/23/21 1410    lidocaine (LIDODERM) 5 %  Every 24 hours        03/23/21 1410    naproxen (NAPROSYN) 500 MG tablet  2 times daily PRN        03/23/21 1410           Elvera MariaGreen, Bascom Biel L, PA-C 03/23/21 1410    Cheryll CockayneHong, Joshua S, MD 03/26/21 2010

## 2021-03-23 NOTE — Discharge Instructions (Addendum)
Please follow-up with your doctors at the [facility] for ongoing evaluation and management.  Your history and physical exam is concerning for disc herniation.  I am glad that your symptoms have improved here in the ED with treatment.  Please take your medications, as directed.  Do not combine naproxen with other NSAIDs.  Continue take your proton pump inhibitors, as directed.  Discontinue the naproxen if you develop any dark black stools. You were given narcotic and or sedative medications while in the emergency department. Do not drive. Do not use machinery or power tools. Do not sign legal documents. Do not drink alcohol. Do not take sleeping pills. Do not supervise children by yourself. Do not participate in activities that require climbing or being in high places.  You will still need to have your MRI, hopefully on expedited outpatient basis.  Activity modification in the interim.  Return to the ED if you develop fevers, chills, inability to move your legs, numbness, urinary retention or incontinence, or any other new or worsening symptoms.

## 2021-03-23 NOTE — ED Triage Notes (Addendum)
Pt reports has history of lower back problems.  Was working in his yard at the beginning of the month and pain got worse.  Reports goes to the [place] and is supposed to have an MRI June 6.  [facility] because [facility] was so [facility].  They instructed him to come to the [place] today but pt says unable to tolerate the long ride at this time.  Denies any problems with bowels or bladder.  Pt says can feel a knot in his lower back.  Pt reports has passed out twice this morning due to pain.
